# Patient Record
Sex: Female | Born: 1970 | Race: White | Hispanic: No | Marital: Single | Smoking: Current every day smoker
Health system: Southern US, Community
[De-identification: ages and names within clinical notes are randomized; demographics above are authoritative.]

## PROBLEM LIST (undated history)

## (undated) DIAGNOSIS — C801 Malignant (primary) neoplasm, unspecified: Secondary | ICD-10-CM

## (undated) DIAGNOSIS — J45909 Unspecified asthma, uncomplicated: Secondary | ICD-10-CM

## (undated) HISTORY — DX: Malignant (primary) neoplasm, unspecified: C80.1

## (undated) HISTORY — DX: Unspecified asthma, uncomplicated: J45.909

## (undated) HISTORY — PX: APPENDECTOMY: SHX54

## (undated) HISTORY — PX: TUBAL LIGATION: SHX77

---

## 2004-06-18 LAB — HM COLONOSCOPY: HM Colonoscopy: NORMAL

## 2004-09-19 ENCOUNTER — Emergency Department: Payer: Self-pay | Admitting: Emergency Medicine

## 2005-01-08 ENCOUNTER — Ambulatory Visit: Payer: Self-pay | Admitting: Gastroenterology

## 2005-06-19 ENCOUNTER — Encounter: Admission: RE | Admit: 2005-06-19 | Discharge: 2005-06-19 | Payer: Self-pay | Admitting: Internal Medicine

## 2005-07-19 ENCOUNTER — Other Ambulatory Visit: Admission: RE | Admit: 2005-07-19 | Discharge: 2005-07-19 | Payer: Self-pay | Admitting: Internal Medicine

## 2005-08-09 ENCOUNTER — Encounter: Admission: RE | Admit: 2005-08-09 | Discharge: 2005-08-09 | Payer: Self-pay | Admitting: Internal Medicine

## 2005-08-16 ENCOUNTER — Encounter: Admission: RE | Admit: 2005-08-16 | Discharge: 2005-08-16 | Payer: Self-pay | Admitting: Internal Medicine

## 2006-02-14 ENCOUNTER — Encounter: Admission: RE | Admit: 2006-02-14 | Discharge: 2006-02-14 | Payer: Self-pay | Admitting: Internal Medicine

## 2006-02-18 DIAGNOSIS — C801 Malignant (primary) neoplasm, unspecified: Secondary | ICD-10-CM

## 2006-02-18 HISTORY — DX: Malignant (primary) neoplasm, unspecified: C80.1

## 2006-07-07 ENCOUNTER — Ambulatory Visit: Payer: Self-pay | Admitting: Dermatology

## 2006-08-18 ENCOUNTER — Encounter: Admission: RE | Admit: 2006-08-18 | Discharge: 2006-08-18 | Payer: Self-pay | Admitting: Internal Medicine

## 2007-08-19 ENCOUNTER — Encounter: Admission: RE | Admit: 2007-08-19 | Discharge: 2007-08-19 | Payer: Self-pay | Admitting: Internal Medicine

## 2007-10-14 ENCOUNTER — Other Ambulatory Visit: Admission: RE | Admit: 2007-10-14 | Discharge: 2007-10-14 | Payer: Self-pay | Admitting: Internal Medicine

## 2010-02-07 ENCOUNTER — Other Ambulatory Visit
Admission: RE | Admit: 2010-02-07 | Discharge: 2010-02-07 | Payer: Self-pay | Source: Home / Self Care | Admitting: Internal Medicine

## 2010-02-15 ENCOUNTER — Encounter
Admission: RE | Admit: 2010-02-15 | Discharge: 2010-02-15 | Payer: Self-pay | Source: Home / Self Care | Attending: Internal Medicine | Admitting: Internal Medicine

## 2010-03-11 ENCOUNTER — Encounter: Payer: Self-pay | Admitting: Internal Medicine

## 2011-05-29 ENCOUNTER — Other Ambulatory Visit: Payer: Self-pay | Admitting: Internal Medicine

## 2011-05-29 DIAGNOSIS — Z1231 Encounter for screening mammogram for malignant neoplasm of breast: Secondary | ICD-10-CM

## 2011-06-03 ENCOUNTER — Ambulatory Visit: Payer: Self-pay

## 2011-06-26 ENCOUNTER — Ambulatory Visit
Admission: RE | Admit: 2011-06-26 | Discharge: 2011-06-26 | Disposition: A | Discharge status: Home / Self Care | Payer: Commercial Indemnity | Source: Ambulatory Visit | Attending: Internal Medicine | Admitting: Internal Medicine

## 2011-06-26 ENCOUNTER — Other Ambulatory Visit (HOSPITAL_COMMUNITY)
Admission: RE | Admit: 2011-06-26 | Discharge: 2011-06-26 | Disposition: A | Discharge status: Home / Self Care | Payer: Commercial Indemnity | Source: Ambulatory Visit | Attending: Internal Medicine | Admitting: Internal Medicine

## 2011-06-26 ENCOUNTER — Other Ambulatory Visit: Payer: Self-pay | Admitting: Internal Medicine

## 2011-06-26 DIAGNOSIS — Z01419 Encounter for gynecological examination (general) (routine) without abnormal findings: Secondary | ICD-10-CM | POA: Insufficient documentation

## 2011-06-26 DIAGNOSIS — Z1231 Encounter for screening mammogram for malignant neoplasm of breast: Secondary | ICD-10-CM

## 2012-02-19 HISTORY — PX: ABDOMINAL HYSTERECTOMY: SHX81

## 2012-04-14 ENCOUNTER — Ambulatory Visit: Payer: Self-pay | Admitting: Obstetrics and Gynecology

## 2012-04-14 LAB — CBC
HCT: 40.8 % (ref 35.0–47.0)
HGB: 13.8 g/dL (ref 12.0–16.0)
MCH: 30.6 pg (ref 26.0–34.0)
Platelet: 421 10*3/uL (ref 150–440)

## 2012-04-14 LAB — URINALYSIS, COMPLETE
Glucose,UR: NEGATIVE mg/dL (ref 0–75)
Leukocyte Esterase: NEGATIVE
Ph: 5 (ref 4.5–8.0)
Squamous Epithelial: 2

## 2012-04-21 ENCOUNTER — Emergency Department: Payer: Self-pay | Admitting: Emergency Medicine

## 2012-05-11 ENCOUNTER — Ambulatory Visit: Payer: Self-pay | Admitting: Obstetrics and Gynecology

## 2012-05-12 LAB — HEMATOCRIT: HCT: 36.5 % (ref 35.0–47.0)

## 2012-05-12 LAB — PATHOLOGY REPORT

## 2012-06-18 ENCOUNTER — Encounter: Payer: Self-pay | Admitting: Internal Medicine

## 2012-06-18 ENCOUNTER — Ambulatory Visit (INDEPENDENT_AMBULATORY_CARE_PROVIDER_SITE_OTHER): Payer: Commercial Indemnity | Admitting: Internal Medicine

## 2012-06-18 VITALS — BP 120/80 | HR 83 | Temp 98.9°F | Ht 63.25 in | Wt 171.0 lb

## 2012-06-18 DIAGNOSIS — R519 Headache, unspecified: Secondary | ICD-10-CM | POA: Insufficient documentation

## 2012-06-18 DIAGNOSIS — Z0000 Encounter for general adult medical examination without abnormal findings: Secondary | ICD-10-CM

## 2012-06-18 DIAGNOSIS — Z634 Disappearance and death of family member: Secondary | ICD-10-CM

## 2012-06-18 DIAGNOSIS — R51 Headache: Secondary | ICD-10-CM | POA: Insufficient documentation

## 2012-06-18 DIAGNOSIS — F411 Generalized anxiety disorder: Secondary | ICD-10-CM | POA: Insufficient documentation

## 2012-06-18 DIAGNOSIS — R41840 Attention and concentration deficit: Secondary | ICD-10-CM | POA: Insufficient documentation

## 2012-06-18 MED ORDER — FLUOXETINE HCL 20 MG PO TABS: 20.0000 mg | ORAL_TABLET | Freq: Every day | ORAL | Status: DC

## 2012-06-18 NOTE — Assessment & Plan Note (Signed)
Ongoing bereavement after death of father 2007-07-01. Offered support today. Will set up counseling.

## 2012-06-18 NOTE — Progress Notes (Signed)
Subjective:    Patient ID: Debbie Campbell, female    DOB: [birth year], 42 y.o.   MRN: [id number]  HPI 42 year old female with history of anxiety, headaches, and asthma presents to establish care. Her primary concern today is increased anxiety and depressed mood. She reports that symptoms first began after the death of her father 5 years ago. 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 She reports that this is extremely stressful. She also describes increased stressors at work. She has been taking Valium as prescribed by her previous PCP with minimal improvement. She has never undergone counseling. She reports that anxious mood limits her ability to function at work and affect her concentration. She also notes a history of ADD in the past as a child for which she was on medication. She questions whether she needs to resume stimulant medication to help with symptoms. She reports difficulty sleeping. She reports feeling overwhelmed. No suicidal ideation.  In regards to headaches, they're described as aching typically on one side of the head. No photo or phonophobia. No nausea or visual changes. They have been ongoing for years but recently seemed more frequent. Her former PCP checked a CT of the head for evaluation which was normal. She has been using tramadol for headache pain with minimal improvement.  Outpatient Encounter Prescriptions as of 06/18/2012  Medication Sig Dispense Refill  . diazepam (VALIUM) 5 MG tablet       . traMADol (ULTRAM) 50 MG tablet       . VENTOLIN HFA 108 (90 BASE) MCG/ACT inhaler       . FLUoxetine (PROZAC) 20 MG tablet Take 1 tablet (20 mg total) by mouth daily.  30 tablet  3  . [DISCONTINUED] HYDROcodone-acetaminophen (NORCO/VICODIN) 5-325 MG per tablet        No facility-administered encounter medications on file as of 06/18/2012.   BP 120/80  Pulse 83  Temp(Src) 98.9 F (37.2 C) (Oral)  Ht 5' 3.25" (1.607 m)  Wt 171 lb (77.565 kg)  BMI  30.04 kg/m2  SpO2 97%  Review of Systems  Constitutional: Negative for fever, chills, appetite change, fatigue and unexpected weight change.  HENT: Negative for ear pain, congestion, sore throat, trouble swallowing, neck pain, voice change and sinus pressure.   Eyes: Negative for visual disturbance.  Respiratory: Negative for cough, shortness of breath, wheezing and stridor.   Cardiovascular: Negative for chest pain, palpitations and leg swelling.  Gastrointestinal: Negative for nausea, vomiting, abdominal pain, diarrhea, constipation, blood in stool, abdominal distention and anal bleeding.  Genitourinary: Negative for dysuria and flank pain.  Musculoskeletal: Negative for myalgias, arthralgias and gait problem.  Skin: Negative for color change and rash.  Neurological: Negative for dizziness and headaches.  Hematological: Negative for adenopathy. Does not bruise/bleed easily.  Psychiatric/Behavioral: Positive for sleep disturbance, dysphoric mood and decreased concentration. Negative for suicidal ideas. The patient is nervous/anxious.        Objective:   Physical Exam  Constitutional: She is oriented to person, place, and time. She appears well-developed and well-nourished. No distress.  HENT:  Head: Normocephalic and atraumatic.  Right Ear: External ear normal.  Left Ear: External ear normal.  Nose: Nose normal.  Mouth/Throat: Oropharynx is clear and moist. No oropharyngeal exudate.  Eyes: Conjunctivae are normal. Pupils are equal, round, and reactive to light. Right eye exhibits no discharge. Left eye exhibits no discharge. No scleral icterus.  Neck: Normal range of motion. Neck supple. No tracheal deviation present.  No thyromegaly present.  Cardiovascular: Normal rate, regular rhythm, normal heart sounds and intact distal pulses.  Exam reveals no gallop and no friction rub.   No murmur heard. Pulmonary/Chest: Effort normal. No accessory muscle usage. Not tachypneic. No respiratory  distress. She has no decreased breath sounds. She has no wheezes. She has no rhonchi. She has no rales. She exhibits no tenderness.  Abdominal: Soft. Bowel sounds are normal. She exhibits no distension. There is no tenderness.  Musculoskeletal: Normal range of motion. She exhibits no edema and no tenderness.  Lymphadenopathy:    She has no cervical adenopathy.  Neurological: She is alert and oriented to person, place, and time. No cranial nerve deficit. She exhibits normal muscle tone. Coordination normal.  Skin: Skin is warm and dry. No rash noted. She is not diaphoretic. No erythema. No pallor.  Psychiatric: Her speech is normal and behavior is normal. Judgment and thought content normal. Her mood appears anxious. Cognition and memory are normal.          Assessment & Plan:

## 2012-06-18 NOTE — Assessment & Plan Note (Addendum)
Increased anxiety and depressed mood after fathers death 5 years ago and with ongoing issues caring for her mother. Offered support today. Will set up counseling. Will also start Fluoxetine 20mg  daily. Follow up 3-4 weeks and prn.

## 2012-06-18 NOTE — Assessment & Plan Note (Signed)
Pt reports h/o ADD. Now having difficulty concentrating which is most likely related to ongoing anxiety. Will set up psychology evaluation with ADD testing.

## 2012-06-18 NOTE — Assessment & Plan Note (Signed)
Recent episodes of headache described as diffuse aching. Most consistent with tension headaches. Suspect exacerbated by anxiety. Will have pt keep headache diary. Will continue prn tramadol for now. Reviewed CT head 2007 which was normal. Follow up 3-4 weeks.

## 2012-07-14 ENCOUNTER — Emergency Department: Payer: Self-pay | Admitting: Emergency Medicine

## 2012-07-14 LAB — COMPREHENSIVE METABOLIC PANEL
Alkaline Phosphatase: 72 U/L (ref 50–136)
Anion Gap: 6 — ABNORMAL LOW (ref 7–16)
BUN: 13 mg/dL (ref 7–18)
Bilirubin,Total: 0.2 mg/dL (ref 0.2–1.0)
Calcium, Total: 9.2 mg/dL (ref 8.5–10.1)
Chloride: 106 mmol/L (ref 98–107)
Co2: 27 mmol/L (ref 21–32)
Creatinine: 0.71 mg/dL (ref 0.60–1.30)
Glucose: 79 mg/dL (ref 65–99)
Potassium: 3.8 mmol/L (ref 3.5–5.1)
SGOT(AST): 21 U/L (ref 15–37)
SGPT (ALT): 25 U/L (ref 12–78)
Sodium: 139 mmol/L (ref 136–145)

## 2012-07-14 LAB — URINALYSIS, COMPLETE
Bilirubin,UR: NEGATIVE
Glucose,UR: NEGATIVE mg/dL (ref 0–75)
Ketone: NEGATIVE
Leukocyte Esterase: NEGATIVE
Nitrite: NEGATIVE
Ph: 6 (ref 4.5–8.0)
RBC,UR: 1 /HPF (ref 0–5)
Specific Gravity: 1.006 (ref 1.003–1.030)
Squamous Epithelial: 2

## 2012-07-14 LAB — CBC
HCT: 39.2 % (ref 35.0–47.0)
HGB: 13.5 g/dL (ref 12.0–16.0)
MCV: 89 fL (ref 80–100)
Platelet: 415 10*3/uL (ref 150–440)
RBC: 4.42 10*6/uL (ref 3.80–5.20)
WBC: 6.8 10*3/uL (ref 3.6–11.0)

## 2012-07-20 ENCOUNTER — Ambulatory Visit: Payer: Commercial Indemnity | Admitting: Internal Medicine

## 2012-08-04 ENCOUNTER — Ambulatory Visit: Payer: Commercial Indemnity | Admitting: Internal Medicine

## 2012-08-07 ENCOUNTER — Telehealth: Payer: Self-pay | Admitting: Internal Medicine

## 2012-08-07 NOTE — Telephone Encounter (Signed)
Patient Information:  Caller Name: Maryama  Phone: [phone]  Patient: Debbie Campbell, Debbie Campbell  Gender: Female  DOB: [birth year]  Age: 42 Years  PCP: Ronna Polio (Adults only)  Pregnant: No  Office Follow Up:  Does the office need to follow up with this patient?: No  Instructions For The Office: HEADS UP THAT PT WAS SENT TO ED- TRIAGE MARKED AS HIGH PRIORITY   Symptoms  Reason For Call & Symptoms: Pt is calling and states that she is vomiting; sx started 08/07/12; vomited x 7 today; diarrhea x3-4 times today;  feeling very nauseated for the last 3 weeks;  the nasueated started soon after the hysterectomy that she had in March;  did have an infection in incision that she staets is completely healed now;  no sx of infection;  no fever; reporting severe headache now; rates 7-8/10; headache started 08/07/12; Motrin taken with no relief; pt states that she feels very weak  Reviewed Health History In EMR: Yes  Reviewed Medications In EMR: Yes  Reviewed Allergies In EMR: Yes  Reviewed Surgeries / Procedures: Yes  Date of Onset of Symptoms: 08/07/2012  Treatments Tried: Pepcid  Treatments Tried Worked: No OB / GYN:  LMP: Unknown  Guideline(s) Used:  Vomiting  Headache  Disposition Per Guideline:   Go to ED Now (or to Office with PCP Approval)  Reason For Disposition Reached:   Severe headache, states "worst headache" of life  Advice Given:  N/A  RN Overrode Recommendation:  Go To ED  Next office appt is 4:15pm and pt is willing to go to ED for evaluation.  She states that she is feeling very weak along with headache, nausea, vomiting and diarrhea; Pt will go to [facility] ED with son driving

## 2012-08-07 NOTE — Telephone Encounter (Signed)
Please Advise

## 2012-08-07 NOTE — Telephone Encounter (Signed)
Agree. Needs to be evaluated in the ED.

## 2012-08-08 ENCOUNTER — Emergency Department: Payer: Self-pay | Admitting: Emergency Medicine

## 2012-08-08 LAB — CBC
HCT: 41.8 % (ref 35.0–47.0)
HGB: 14.8 g/dL (ref 12.0–16.0)
MCHC: 35.4 g/dL (ref 32.0–36.0)
MCV: 87 fL (ref 80–100)
Platelet: 487 10*3/uL — ABNORMAL HIGH (ref 150–440)
RDW: 12.1 % (ref 11.5–14.5)

## 2012-08-08 LAB — LIPASE, BLOOD: Lipase: 132 U/L (ref 73–393)

## 2012-08-08 LAB — COMPREHENSIVE METABOLIC PANEL
Albumin: 4.3 g/dL (ref 3.4–5.0)
Alkaline Phosphatase: 72 U/L (ref 50–136)
Anion Gap: 9 (ref 7–16)
BUN: 16 mg/dL (ref 7–18)
Bilirubin,Total: 0.4 mg/dL (ref 0.2–1.0)
Calcium, Total: 10 mg/dL (ref 8.5–10.1)
Chloride: 102 mmol/L (ref 98–107)
Co2: 26 mmol/L (ref 21–32)
EGFR (African American): >60
EGFR (Non-African Amer.): >60
Glucose: 140 mg/dL — ABNORMAL HIGH (ref 65–99)
Osmolality: 277 (ref 275–301)
Potassium: 3.5 mmol/L (ref 3.5–5.1)
SGOT(AST): 11 U/L — ABNORMAL LOW (ref 15–37)
Sodium: 137 mmol/L (ref 136–145)

## 2012-08-08 LAB — URINALYSIS, COMPLETE
Blood: NEGATIVE
Glucose,UR: NEGATIVE mg/dL (ref 0–75)
Leukocyte Esterase: NEGATIVE
Nitrite: NEGATIVE
Ph: 9 (ref 4.5–8.0)
RBC,UR: 6 /HPF (ref 0–5)
Specific Gravity: 1.026 (ref 1.003–1.030)
Squamous Epithelial: 12
WBC UR: 1 /HPF (ref 0–5)

## 2012-08-10 ENCOUNTER — Emergency Department: Payer: Self-pay | Admitting: Emergency Medicine

## 2012-08-10 LAB — COMPREHENSIVE METABOLIC PANEL
Alkaline Phosphatase: 64 U/L (ref 50–136)
Anion Gap: 8 (ref 7–16)
BUN: 13 mg/dL (ref 7–18)
Bilirubin,Total: 0.3 mg/dL (ref 0.2–1.0)
Chloride: 103 mmol/L (ref 98–107)
Co2: 27 mmol/L (ref 21–32)
Creatinine: 0.94 mg/dL (ref 0.60–1.30)
EGFR (Non-African Amer.): >60
Glucose: 108 mg/dL — ABNORMAL HIGH (ref 65–99)
Sodium: 138 mmol/L (ref 136–145)
Total Protein: 7.1 g/dL (ref 6.4–8.2)

## 2012-08-10 LAB — URINALYSIS, COMPLETE
Bilirubin,UR: NEGATIVE
Blood: NEGATIVE
Ketone: NEGATIVE
Nitrite: NEGATIVE
Protein: NEGATIVE
RBC,UR: 3 /HPF (ref 0–5)
Squamous Epithelial: 6
WBC UR: 2 /HPF (ref 0–5)

## 2012-08-10 LAB — CBC WITH DIFFERENTIAL/PLATELET
Basophil #: 0 10*3/uL (ref 0.0–0.1)
Basophil %: 0.4 %
Eosinophil #: 0.1 10*3/uL (ref 0.0–0.7)
Eosinophil %: 0.5 %
HCT: 42 % (ref 35.0–47.0)
HGB: 14.4 g/dL (ref 12.0–16.0)
Lymphocyte #: 2.1 10*3/uL (ref 1.0–3.6)
MCH: 30.5 pg (ref 26.0–34.0)
MCHC: 34.3 g/dL (ref 32.0–36.0)
MCV: 89 fL (ref 80–100)
Monocyte #: 0.5 x10 3/mm (ref 0.2–0.9)
Neutrophil #: 9 10*3/uL — ABNORMAL HIGH (ref 1.4–6.5)
RDW: 12.3 % (ref 11.5–14.5)
WBC: 11.6 10*3/uL — ABNORMAL HIGH (ref 3.6–11.0)

## 2012-08-12 ENCOUNTER — Encounter: Payer: Self-pay | Admitting: Adult Health

## 2012-08-12 ENCOUNTER — Ambulatory Visit (INDEPENDENT_AMBULATORY_CARE_PROVIDER_SITE_OTHER): Payer: Commercial Indemnity | Admitting: Adult Health

## 2012-08-12 VITALS — BP 128/68 | HR 76 | Temp 98.3°F | Resp 12 | Wt 164.6 lb

## 2012-08-12 DIAGNOSIS — F411 Generalized anxiety disorder: Secondary | ICD-10-CM

## 2012-08-12 DIAGNOSIS — R6889 Other general symptoms and signs: Secondary | ICD-10-CM

## 2012-08-12 DIAGNOSIS — R112 Nausea with vomiting, unspecified: Secondary | ICD-10-CM

## 2012-08-12 DIAGNOSIS — R899 Unspecified abnormal finding in specimens from other organs, systems and tissues: Secondary | ICD-10-CM

## 2012-08-12 DIAGNOSIS — R51 Headache: Secondary | ICD-10-CM

## 2012-08-12 LAB — POCT URINALYSIS DIPSTICK
Ketones, UA: NEGATIVE
Leukocytes, UA: NEGATIVE
Spec Grav, UA: 1.025
pH, UA: 7

## 2012-08-12 LAB — CBC WITH DIFFERENTIAL/PLATELET
Eosinophils Absolute: 0.1 10*3/uL (ref 0.0–0.7)
Eosinophils Relative: 0.9 % (ref 0.0–5.0)
HCT: 37.8 % (ref 36.0–46.0)
Hemoglobin: 12.9 g/dL (ref 12.0–15.0)
Lymphs Abs: 2.6 10*3/uL (ref 0.7–4.0)
Monocytes Absolute: 0.3 10*3/uL (ref 0.1–1.0)
RDW: 12.2 % (ref 11.5–14.6)
WBC: 9.5 10*3/uL (ref 4.5–10.5)

## 2012-08-12 LAB — BASIC METABOLIC PANEL
Creatinine, Ser: 0.7 mg/dL (ref 0.4–1.2)
Potassium: 3.1 mEq/L — ABNORMAL LOW (ref 3.5–5.1)

## 2012-08-12 MED ORDER — BUTALBITAL-APAP-CAFFEINE 50-325-40 MG PO TABS: 1.0000 | ORAL_TABLET | Freq: Two times a day (BID) | ORAL | Status: DC | PRN

## 2012-08-12 MED ORDER — ALPRAZOLAM 0.5 MG PO TABS: 0.5000 mg | ORAL_TABLET | Freq: Every evening | ORAL | Status: DC | PRN

## 2012-08-12 MED ORDER — ALBUTEROL SULFATE HFA 108 (90 BASE) MCG/ACT IN AERS: 1.0000 | INHALATION_SPRAY | Freq: Four times a day (QID) | RESPIRATORY_TRACT | Status: DC | PRN

## 2012-08-12 NOTE — Assessment & Plan Note (Signed)
Headache possibly due to significant amount of nausea and vomiting without any oral intake. She is taking Tylenol but this is currently not helping.  Fioricet for severe headache. Return for followup in one to 2 weeks.

## 2012-08-12 NOTE — Progress Notes (Signed)
Subjective:    Patient ID: Debbie Campbell, female    DOB: May 09, 1970, 42 y.o.   MRN: [id number]  HPI  Patient is a pleasant 41 year old female who presents to clinic for followup recent ED visit. Patient had 2 visits to ED after multiple episodes of vomiting. The first visit was on 08/08/2012 and the second visit was 08/10/2012. She had workup on 08/08/12 consisting of  Abdominal CT which showed diverticulosis and right ovarian cyst. Blood work as follows: WBC 18.2, hemoglobin 14.8, hematocrit 41.8, platelets 487, BUN 16, creatinine 0.86, sodium 137, potassium 3.5, liver enzymes were normal. Patient was discharged home with Zofran for nausea and vomiting. At home, symptoms persisted and patient continued vomiting despite medication. She returned to the ED on 08/10/2012. Repeat labs showed WBC improving at 11.6, BUN and creatinine were normal as well his sodium. Potassium was low at 3.2. EKG showed normal sinus rhythm. Patient was given IV hydration. Once she was observed to tolerating PO patient was sent home on Phenergan suppositories. Today she is feeling less nauseated, but still not feeling fully well. She is complaining of a headache.she has taken Tylenol and ibuprofen without improvement.  Patient is tearful during visit. She reports that she has never been this sick. "I am the one that is always taking care of everybody".    Past Medical History  Diagnosis Date  . Asthma   . 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 Tubal ligation    . Appendectomy    . Abdominal hysterectomy  2014    Dr. Logan Bores, menorrhagia, ovaries intact    Family History  Problem Relation Age of Onset  . Hypertension Mother   . Arthritis Father   . Cirrhosis Father     from tylenol toxicity    History   Social History  . Marital Status: Single    Spouse Name: N/A    Number of Children: N/A  .  Years of Education: N/A   Occupational History  . Not on file.   Social History Main Topics  . Smoking status: Current Every Day Smoker -- 0.25 packs/day    Types: Cigarettes  . Smokeless tobacco: Never Used  . Alcohol Use: No  . Drug Use: Not on file  . Sexually Active: Not on file   Other Topics Concern  . Not on file   Social History Narrative   Lives in [place], From [place].      Work - [employer]      Review of Systems  Constitutional: Positive for fatigue. Negative for fever, chills and appetite change.       It is not back to normal. Patient has not had anything to eat since her episodes of vomiting on Monday.  Eyes: Negative.   Respiratory: Negative for chest tightness, shortness of breath and wheezing.   Cardiovascular: Negative for chest pain, palpitations and leg swelling.  Gastrointestinal: Negative for nausea, vomiting, abdominal pain, diarrhea, blood in stool and abdominal distention.       Abdomen tender at epigastric area.  Neurological: Positive for headaches. Negative for dizziness, syncope, weakness, light-headedness and numbness.  Psychiatric/Behavioral: The patient is nervous/anxious.   All other systems reviewed and are negative.    BP 128/68  Pulse 76  Temp(Src) 98.3 F (36.8 C) (Oral)  Resp 12  Wt 164 lb 9.6 oz (74.662 kg)  BMI  28.91 kg/m2  SpO2 99%    Objective:   Physical Exam  Constitutional: She is oriented to person, place, and time. She appears well-developed and well-nourished.  Clean, groomed. Appears to be in a mildly weakened state  HENT:  Head: Normocephalic and atraumatic.  Eyes: Conjunctivae are normal. Pupils are equal, round, and reactive to light.  Cardiovascular: Normal rate, regular rhythm and normal heart sounds.  Exam reveals no gallop and no friction rub.   No murmur heard. Pulmonary/Chest: Effort normal and breath sounds normal. No respiratory distress. She has no wheezes. She has no rales. She exhibits no  tenderness.  Abdominal: Soft. Bowel sounds are normal. She exhibits no mass. There is tenderness. There is no rebound and no guarding.  Mild tenderness upon palpating epigastric area. No other abdominal areas tender.  Musculoskeletal: Normal range of motion. She exhibits no edema.  Neurological: She is alert and oriented to person, place, and time. Coordination normal.  Skin: Skin is warm and dry.  Psychiatric: She has a normal mood and affect. Her behavior is normal. Judgment and thought content normal.  Patient was tearful during clinic.        Assessment & Plan:

## 2012-08-12 NOTE — Assessment & Plan Note (Signed)
Patient feels she has been on Valium too long and has become tolerant to the drug. Stop Valium for medication holiday. Try Xanax 0.5 mg daily as needed for anxiety. Return to clinic in 1-2 weeks for followup.

## 2012-08-12 NOTE — Assessment & Plan Note (Addendum)
Nausea and vomiting severe requiring 2 separate ED visits. Patient with hypokalemia. She is feeling improved. However, patient is very tearful. Anxious about "being so sick". Counseling and reassurance provided regarding her symptoms. Recheck labs: CBC with differential, metabolic panel and urinalysis. Start Nexium 40 mg daily. Samples provided. If this helps patient's epigastric discomfort, she will continue with over-the-counter PPI. Return for followup in one to 2 weeks. Note greater than 45 minutes were spent in face-to-face communication with patient in counseling, assessment, planning and implementation of the problems listed during this visit.

## 2012-08-12 NOTE — Patient Instructions (Addendum)
  Please have labs drawn prior to leaving the office.  Once I get the results from your labwork, I will contact you.  Start back on the Prozac.  I am prescribing Xanax 0.5 mg one tablet as needed.  I am giving you a sample of Nexium for your stomach. Take 1 capsule first thing in the morning. If this is helpful, you can continue taking this medication. It is soled over-the-counter.  Take Fioricet for severe headache. You can take up to 2 daily as needed. If your headaches are not severe, take Tylenol.

## 2012-08-17 ENCOUNTER — Other Ambulatory Visit (INDEPENDENT_AMBULATORY_CARE_PROVIDER_SITE_OTHER): Payer: Commercial Indemnity

## 2012-08-17 ENCOUNTER — Telehealth: Payer: Self-pay | Admitting: Internal Medicine

## 2012-08-17 DIAGNOSIS — R6889 Other general symptoms and signs: Secondary | ICD-10-CM

## 2012-08-17 DIAGNOSIS — Z0000 Encounter for general adult medical examination without abnormal findings: Secondary | ICD-10-CM

## 2012-08-17 DIAGNOSIS — R899 Unspecified abnormal finding in specimens from other organs, systems and tissues: Secondary | ICD-10-CM

## 2012-08-17 DIAGNOSIS — Z0279 Encounter for issue of other medical certificate: Secondary | ICD-10-CM

## 2012-08-17 LAB — COMPREHENSIVE METABOLIC PANEL
ALT: 21 U/L (ref 0–35)
AST: 16 U/L (ref 0–37)
Albumin: 3.9 g/dL (ref 3.5–5.2)
Calcium: 9.2 mg/dL (ref 8.4–10.5)
Chloride: 106 mEq/L (ref 96–112)
Creatinine, Ser: 0.7 mg/dL (ref 0.4–1.2)
Potassium: 4.5 mEq/L (ref 3.5–5.1)
Sodium: 136 mEq/L (ref 135–145)
Total Protein: 6.7 g/dL (ref 6.0–8.3)

## 2012-08-17 LAB — CBC WITH DIFFERENTIAL/PLATELET
Basophils Absolute: 0 10*3/uL (ref 0.0–0.1)
Basophils Relative: 0.3 % (ref 0.0–3.0)
Eosinophils Absolute: 0.3 10*3/uL (ref 0.0–0.7)
MCHC: 33.8 g/dL (ref 30.0–36.0)
MCV: 92.2 fl (ref 78.0–100.0)
Monocytes Absolute: 0.4 10*3/uL (ref 0.1–1.0)
Neutrophils Relative %: 70.1 % (ref 43.0–77.0)
RBC: 4.21 Mil/uL (ref 3.87–5.11)
RDW: 13.1 % (ref 11.5–14.6)

## 2012-08-17 LAB — LIPID PANEL: Triglycerides: 176 mg/dL — ABNORMAL HIGH (ref 0.0–149.0)

## 2012-08-17 LAB — BASIC METABOLIC PANEL
CO2: 24 mEq/L (ref 19–32)
Chloride: 106 mEq/L (ref 96–112)
Creatinine, Ser: 0.7 mg/dL (ref 0.4–1.2)
Sodium: 136 mEq/L (ref 135–145)

## 2012-08-17 NOTE — Telephone Encounter (Signed)
Forms in Dr. Dan Humphreys folder

## 2012-08-17 NOTE — Telephone Encounter (Signed)
Pt dropped off [facility] forms   In box

## 2012-08-18 ENCOUNTER — Encounter: Payer: Self-pay | Admitting: *Deleted

## 2012-08-18 LAB — VITAMIN D 25 HYDROXY (VIT D DEFICIENCY, FRACTURES): Vit D, 25-Hydroxy: 34 ng/mL (ref 30–89)

## 2012-08-19 NOTE — Telephone Encounter (Signed)
Forms completed and ready to be picked up. Patient aware, forms up front for pick up

## 2012-08-26 ENCOUNTER — Ambulatory Visit (INDEPENDENT_AMBULATORY_CARE_PROVIDER_SITE_OTHER): Payer: Commercial Indemnity | Admitting: Adult Health

## 2012-08-26 ENCOUNTER — Encounter: Payer: Self-pay | Admitting: Adult Health

## 2012-08-26 VITALS — BP 106/70 | HR 100 | Temp 98.0°F | Resp 12 | Wt 163.0 lb

## 2012-08-26 DIAGNOSIS — R112 Nausea with vomiting, unspecified: Secondary | ICD-10-CM

## 2012-08-26 DIAGNOSIS — K219 Gastro-esophageal reflux disease without esophagitis: Secondary | ICD-10-CM | POA: Insufficient documentation

## 2012-08-26 DIAGNOSIS — R51 Headache: Secondary | ICD-10-CM

## 2012-08-26 MED ORDER — PANTOPRAZOLE SODIUM 20 MG PO TBEC: 20.0000 mg | DELAYED_RELEASE_TABLET | Freq: Every day | ORAL | Status: AC

## 2012-08-26 MED ORDER — VARENICLINE TARTRATE 0.5 MG X 11 & 1 MG X 42 PO MISC: ORAL | Status: DC

## 2012-08-26 NOTE — Assessment & Plan Note (Signed)
Symptoms have completely resolved. She is feeling well. Taking Nexium daily for her acid reflux.

## 2012-08-26 NOTE — Progress Notes (Signed)
  Subjective:    Patient ID: Debbie Campbell, female    DOB: 03-18-1970, 42 y.o.   MRN: [id number]  HPI  Is a pleasant 42 year old female who presents to clinic for followup of nausea and vomiting and headaches. She was seen in clinic approximately 2 weeks ago after going to the emergency room x2 for significant nausea and vomiting. She was also reporting stomach upset and was started on Nexium. Patient reports that this has helped her significantly. For the headache, she was started on Fioricet. Patient reports having to take this medication twice with good results. She is feeling much better and is in great spirits.  Current Outpatient Prescriptions on File Prior to Visit  Medication Sig Dispense Refill  . albuterol (VENTOLIN HFA) 108 (90 BASE) MCG/ACT inhaler Inhale 1 puff into the lungs every 6 (six) hours as needed for wheezing.  1 Inhaler  11  . ALPRAZolam (XANAX) 0.5 MG tablet Take 1 tablet (0.5 mg total) by mouth at bedtime as needed for sleep.  30 tablet  0  . butalbital-acetaminophen-caffeine (FIORICET, ESGIC) 50-325-40 MG per tablet Take 1 tablet by mouth 2 (two) times daily as needed for headache.  30 tablet  0  . FLUoxetine (PROZAC) 20 MG tablet Take 1 tablet (20 mg total) by mouth daily.  30 tablet  3  . ondansetron (ZOFRAN-ODT) 4 MG disintegrating tablet Take 4 mg by mouth every 8 (eight) hours as needed.       Marland Kitchen oxyCODONE-acetaminophen (PERCOCET/ROXICET) 5-325 MG per tablet Take 1 tablet by mouth every 6 (six) hours as needed.       . promethazine (PHENERGAN) 25 MG suppository Place 25 mg rectally every 6 (six) hours as needed.        No current facility-administered medications on file prior to visit.    Review of Systems  Constitutional: Negative.   Respiratory: Negative.   Cardiovascular: Negative.   Gastrointestinal: Negative.   Neurological: Negative for headaches.  All other systems reviewed and are negative.       Objective:   Physical Exam  Constitutional:  She is oriented to person, place, and time.  Overweight, pleasant female in no apparent distress  HENT:  Head: Normocephalic and atraumatic.  Cardiovascular: Normal rate, regular rhythm and normal heart sounds.  Exam reveals no gallop and no friction rub.   No murmur heard. Pulmonary/Chest: Effort normal and breath sounds normal. No respiratory distress.  Abdominal: Soft. Bowel sounds are normal.  Lymphadenopathy:    She has no cervical adenopathy.  Neurological: She is alert and oriented to person, place, and time.  Skin: Skin is warm and dry.  Psychiatric: She has a normal mood and affect. Her behavior is normal. Judgment and thought content normal.          Assessment & Plan:

## 2012-08-26 NOTE — Assessment & Plan Note (Signed)
Patient is doing well on Nexium. Samples have been provided during previous visit. Unfortunately this medication is not covered on her insurance plan. Ordered Protonix 20 mg daily.

## 2012-08-26 NOTE — Patient Instructions (Addendum)
  Continue to take medication for acid reflux. Your insurance is not covering the Nexium so I am ordering Protonix.  Continue Fioricet as needed for headaches.  Please call if any questions or concerns.

## 2012-08-26 NOTE — Assessment & Plan Note (Signed)
No headaches reported since seen approximately 2 weeks ago. Fioricet helped with her headache. Continue as needed.

## 2012-08-31 ENCOUNTER — Telehealth: Payer: Self-pay | Admitting: Internal Medicine

## 2012-08-31 NOTE — Telephone Encounter (Signed)
Form completed and faxed. 

## 2012-08-31 NOTE — Telephone Encounter (Signed)
Patient dropped off [facility] paperwork to be filled out  On form #5 needs exact dates.  Pt stated this needed to be faxed back before 09/07/12.  In box

## 2012-09-08 ENCOUNTER — Other Ambulatory Visit: Payer: Self-pay | Admitting: Adult Health

## 2012-09-09 NOTE — Telephone Encounter (Signed)
Ok to refill 

## 2012-09-10 ENCOUNTER — Encounter: Payer: Self-pay | Admitting: Emergency Medicine

## 2012-09-10 ENCOUNTER — Encounter: Payer: Self-pay | Admitting: Adult Health

## 2012-09-10 ENCOUNTER — Ambulatory Visit (INDEPENDENT_AMBULATORY_CARE_PROVIDER_SITE_OTHER): Payer: Commercial Indemnity | Admitting: Adult Health

## 2012-09-10 VITALS — BP 108/76 | HR 114 | Temp 98.5°F | Resp 12 | Wt 158.0 lb

## 2012-09-10 DIAGNOSIS — F411 Generalized anxiety disorder: Secondary | ICD-10-CM

## 2012-09-10 MED ORDER — CLONAZEPAM 0.5 MG PO TABS: ORAL_TABLET | ORAL | Status: DC

## 2012-09-10 MED ORDER — FLUOXETINE HCL 20 MG PO TABS: ORAL_TABLET | ORAL | Status: DC

## 2012-09-10 NOTE — Patient Instructions (Addendum)
  Please stop the xanax. I am starting you on klonopin (clonazepam) 0.5 mg twice daily as needed. This is the same type of medication as the xanax except that the klonopin lasts longer. Take twice daily only as needed. If you are having a good day then do not take.  Continue the good job of not smoking. You will feel really good once you conquer this.  I am also increasing your Prozac to 40 mg. You will take 20 mg in the morning and 20 mg in the evening.  Please call if you have any questions or concerns.

## 2012-09-10 NOTE — Progress Notes (Signed)
Subjective:    Patient ID: Debbie Campbell, female    DOB: Feb 11, 1971, 42 y.o.   MRN: [id number]  HPI  Patient is a pleasant 42 year old female with history of anxiety who presents to clinic with increasing episodes of anxiety, shakiness, and inability to sleep after she stopped smoking 3 days ago. She did not start the Chantix. She has been experiencing some shortness of breath with these "panic attacks". She has been using her inhaler which relieved her symptoms. In addition, she has increased stress at work which is not helping her situation. She is trying to deal with this situation as best as possible and tries to participate in distracting activities after work to help her cope. She is currently on Xanax 0.5 mg at bedtime as needed and she is also on Prozac 20 mg which was started in May 2014.   Current Outpatient Prescriptions on File Prior to Visit  Medication Sig Dispense Refill  . albuterol (VENTOLIN HFA) 108 (90 BASE) MCG/ACT inhaler Inhale 1 puff into the lungs every 6 (six) hours as needed for wheezing.  1 Inhaler  11  . butalbital-acetaminophen-caffeine (FIORICET, ESGIC) 50-325-40 MG per tablet Take 1 tablet by mouth 2 (two) times daily as needed for headache.  30 tablet  0  . ondansetron (ZOFRAN-ODT) 4 MG disintegrating tablet Take 4 mg by mouth every 8 (eight) hours as needed.       Marland Kitchen oxyCODONE-acetaminophen (PERCOCET/ROXICET) 5-325 MG per tablet Take 1 tablet by mouth every 6 (six) hours as needed.       . pantoprazole (PROTONIX) 20 MG tablet Take 1 tablet (20 mg total) by mouth daily.  30 tablet  11  . promethazine (PHENERGAN) 25 MG suppository Place 25 mg rectally every 6 (six) hours as needed.       . varenicline (CHANTIX STARTING MONTH PAK) 0.5 MG X 11 & 1 MG X 42 tablet Take one 0.5 mg tablet by mouth once daily for 3 days, then increase to one 0.5 mg tablet twice daily for 4 days, then increase to one 1 mg tablet twice daily.  53 tablet  0   No current  facility-administered medications on file prior to visit.   Review of Systems  Constitutional: Negative.   HENT: Negative.   Eyes: Negative.   Respiratory: Positive for chest tightness, shortness of breath and wheezing.   Cardiovascular: Negative.   Gastrointestinal: Negative.   Psychiatric/Behavioral: Positive for sleep disturbance. Negative for suicidal ideas. The patient is nervous/anxious.   All other systems reviewed and are negative.     BP 108/76  Pulse 114  Temp(Src) 98.5 F (36.9 C) (Oral)  Resp 12  Wt 158 lb (71.668 kg)  BMI 27.75 kg/m2  SpO2 98%    Objective:   Physical Exam  Constitutional: She is oriented to person, place, and time. She appears well-developed and well-nourished.  Tearful throughout visit.  HENT:  Head: Normocephalic and atraumatic.  Cardiovascular: Normal rate, regular rhythm and normal heart sounds.  Exam reveals no gallop.   No murmur heard. Pulmonary/Chest: Effort normal. No respiratory distress. She has wheezes. She has no rales. She exhibits no tenderness.  Mild expiratory wheezing left posterior lobes. Patient recently used her albuterol inhaler.  Neurological: She is alert and oriented to person, place, and time.  Skin: Skin is warm and dry.  Psychiatric: Her behavior is normal. Judgment and thought content normal.  Patient is tearful and crying.          Assessment & Plan:

## 2012-09-10 NOTE — Assessment & Plan Note (Signed)
Patient having increased anxiety which is multifactorial given that she is trying to stop smoking and also increased stressors at work. She has a new supervisor who does not seem to differentiate the "good" employees from the "slackers". Debbie Campbell has never been written up at work and was advised recently that "if she did not clock out at 12:30pm to go to lunch she would be written up". 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 She is understandably upset about this because she feels she was going above and beyond to help a customer and this was not acknowledge. Instead she feels that her new boss is so by the book that she is unable to differentiate or even understand the circumstances that led Debbie Campbell to be late in clocking out for lunch. She is visibly upset during the visit and crying. Discussed that we will stop the xanax and start her on klonopin which is longer lasting. I am also increasing her Prozac to 40 mg. She will take 20 mg in the am and 20 mg in the PM. Debbie Campbell will call with any questions or concerns or if her symptoms do not improve. Provided her with a prescription for klonopin and also a letter to return to work. Note greater than 30 minutes were spent in face to face communication and counseling with patient in the assessment, evaluation and planning pertaining to her symptoms.

## 2012-09-11 ENCOUNTER — Encounter: Payer: Self-pay | Admitting: Adult Health

## 2012-09-14 ENCOUNTER — Other Ambulatory Visit: Payer: Self-pay | Admitting: *Deleted

## 2012-09-14 MED ORDER — ALPRAZOLAM 0.5 MG PO TABS: 0.5000 mg | ORAL_TABLET | Freq: Every evening | ORAL | Status: DC | PRN

## 2012-09-14 NOTE — Telephone Encounter (Signed)
Refill Request  Alprazolam 0.5 mg tab  Take one tablet by mouth at bedtime as needed for sleep

## 2012-09-15 ENCOUNTER — Other Ambulatory Visit: Payer: Self-pay | Admitting: Adult Health

## 2012-09-15 NOTE — Telephone Encounter (Signed)
Rx faxed to pharmacy  

## 2012-09-18 ENCOUNTER — Ambulatory Visit: Payer: Commercial Indemnity | Admitting: Internal Medicine

## 2012-09-22 ENCOUNTER — Encounter: Payer: Self-pay | Admitting: Adult Health

## 2012-09-22 ENCOUNTER — Ambulatory Visit (INDEPENDENT_AMBULATORY_CARE_PROVIDER_SITE_OTHER): Payer: Commercial Indemnity | Admitting: Adult Health

## 2012-09-22 VITALS — BP 116/70 | HR 82 | Temp 98.3°F | Resp 12 | Ht 63.0 in | Wt 157.0 lb

## 2012-09-22 DIAGNOSIS — F411 Generalized anxiety disorder: Secondary | ICD-10-CM

## 2012-09-22 DIAGNOSIS — Z0000 Encounter for general adult medical examination without abnormal findings: Secondary | ICD-10-CM

## 2012-09-22 LAB — LIPID PANEL
HDL: 36.1 mg/dL — ABNORMAL LOW (ref >39.00)
Total CHOL/HDL Ratio: 5
Triglycerides: 207 mg/dL — ABNORMAL HIGH (ref 0.0–149.0)
VLDL: 41.4 mg/dL — ABNORMAL HIGH (ref 0.0–40.0)

## 2012-09-22 LAB — GLUCOSE, RANDOM: Glucose, Bld: 85 mg/dL (ref 70–99)

## 2012-09-22 NOTE — Progress Notes (Signed)
Subjective:    Patient ID: Debbie Campbell, female    DOB: 08/26/1970, 42 y.o.   MRN: [id number]  HPI  Debbie Campbell is a 42 year old female who presents to clinic for a work health screening. She presented to clinic approximately 4 weeks ago stating that her job was requiring nicotine testing to be able to qualify for the lower rate insurance plan. At that time, she was very upset about her job telling her she had to quit smoking otherwise she would have to pay full premium. This actually turned out to be one of the best things that happened to [name]. Because she did not want to pay full price, she went ahead and picked a date to quit smoking. She has been smoke free for 14 days and she reports feeling wonderful. She takes Xanax as needed for anxiety and she reports not having to take it on a regular basis like she was before. She is in very good spirits and excited about what she has accomplished.   Current Outpatient Prescriptions on File Prior to Visit  Medication Sig Dispense Refill  . albuterol (VENTOLIN HFA) 108 (90 BASE) MCG/ACT inhaler Inhale 1 puff into the lungs every 6 (six) hours as needed for wheezing.  1 Inhaler  11  . ALPRAZolam (XANAX) 0.5 MG tablet Take 1 tablet (0.5 mg total) by mouth at bedtime as needed for sleep.  30 tablet  0  . butalbital-acetaminophen-caffeine (FIORICET, ESGIC) 50-325-40 MG per tablet Take 1 tablet by mouth 2 (two) times daily as needed for headache.  30 tablet  0  . FLUoxetine (PROZAC) 20 MG tablet Take 1 tablet in the morning and one in the evening  60 tablet  5  . ondansetron (ZOFRAN-ODT) 4 MG disintegrating tablet Take 4 mg by mouth every 8 (eight) hours as needed.       Marland Kitchen oxyCODONE-acetaminophen (PERCOCET/ROXICET) 5-325 MG per tablet Take 1 tablet by mouth every 6 (six) hours as needed.       . pantoprazole (PROTONIX) 20 MG tablet Take 1 tablet (20 mg total) by mouth daily.  30 tablet  11  . promethazine (PHENERGAN) 25 MG suppository Place 25 mg rectally  every 6 (six) hours as needed.       . varenicline (CHANTIX STARTING MONTH PAK) 0.5 MG X 11 & 1 MG X 42 tablet Take one 0.5 mg tablet by mouth once daily for 3 days, then increase to one 0.5 mg tablet twice daily for 4 days, then increase to one 1 mg tablet twice daily.  53 tablet  0   No current facility-administered medications on file prior to visit.   Review of Systems  Constitutional: Negative.   HENT: Negative.   Eyes: Negative.   Respiratory: Negative.   Cardiovascular: Negative.   Gastrointestinal: Negative.   Genitourinary: Negative.   Musculoskeletal: Negative.   Neurological: Negative.   Psychiatric/Behavioral: Positive for sleep disturbance. The patient is nervous/anxious.   All other systems reviewed and are negative.    BP 116/70  Pulse 82  Temp(Src) 98.3 F (36.8 C) (Oral)  Resp 12  Ht 5\' 3"  (1.6 m)  Wt 157 lb (71.215 kg)  BMI 27.82 kg/m2  SpO2 96%    Objective:   Physical Exam  Constitutional: She is oriented to person, place, and time.  42 year old female in no apparent distress. [name] has lost 20 pounds since May.  HENT:  Head: Normocephalic and atraumatic.  Eyes: Conjunctivae and EOM are normal. Pupils are equal, round, and reactive  to light.  Neck: Normal range of motion. Neck supple. No tracheal deviation present.  Cardiovascular: Normal rate, regular rhythm, normal heart sounds and intact distal pulses.  Exam reveals no gallop and no friction rub.   No murmur heard. Pulmonary/Chest: Effort normal and breath sounds normal. No respiratory distress. She has no wheezes. She has no rales. She exhibits no tenderness.  Abdominal: Soft. Bowel sounds are normal.  Musculoskeletal: Normal range of motion. She exhibits no edema and no tenderness.  Neurological: She is alert and oriented to person, place, and time.  Skin: Skin is warm and dry.  Psychiatric: She has a normal mood and affect. Her behavior is normal. Judgment and thought content normal.           Assessment & Plan:

## 2012-09-22 NOTE — Assessment & Plan Note (Signed)
She is doing very well. Reports having to use xanax occasionally. Continue to follow.

## 2012-09-22 NOTE — Assessment & Plan Note (Signed)
Normal physical exam. Screening required by employer for medical insurance for eligibility for wellness reward. Debbie Campbell quit smoking 14 days ago and has no desire to start back. Congratulated her on her accomplishment and encouraged her to stay on track. Labs: lipids, creatinine, HbA1c, glucose, cotinine. Form to be completed and returned to employer. We will notify Debbie Campbell once form is fully completed.

## 2012-09-22 NOTE — Patient Instructions (Signed)
  Congratulations on your accomplishment of quitting smoking !!!!  You are doing very well.  Please have your lab work done. Once it is available we will contact you to come pick up your form.  Please call with any questions or concerns.

## 2012-09-23 LAB — LDL CHOLESTEROL, DIRECT: Direct LDL: 124.4 mg/dL

## 2012-09-27 ENCOUNTER — Encounter: Payer: Self-pay | Admitting: Adult Health

## 2012-09-28 ENCOUNTER — Other Ambulatory Visit: Payer: Self-pay | Admitting: *Deleted

## 2012-09-28 ENCOUNTER — Encounter: Payer: Self-pay | Admitting: Adult Health

## 2012-09-28 MED ORDER — FLUOXETINE HCL 20 MG PO CAPS: 20.0000 mg | ORAL_CAPSULE | Freq: Two times a day (BID) | ORAL | Status: DC

## 2012-09-28 NOTE — Telephone Encounter (Signed)
Ok to change her Fluoxetine to capsules?

## 2012-09-30 ENCOUNTER — Telehealth: Payer: Self-pay | Admitting: *Deleted

## 2012-09-30 NOTE — Telephone Encounter (Signed)
Message copied by Dema Severin on Wed Sep 30, 2012  9:31 AM ------      Message from: Novella Olive      Created: Tue Sep 29, 2012  1:09 PM       Kriste Basque,      Can you call one of the [facility] Pharmacist and see if they could look up the database to determine if patient has been prescribed dilaudid by other provider?            Thanks,      Raquel ------

## 2012-09-30 NOTE — Telephone Encounter (Signed)
Spoke with pharmacist at [facility], pt has not had Dilaudid filled at any [facility]. States she can not access database to see all pharmacies.

## 2012-10-16 ENCOUNTER — Ambulatory Visit (INDEPENDENT_AMBULATORY_CARE_PROVIDER_SITE_OTHER): Payer: Commercial Indemnity | Admitting: Adult Health

## 2012-10-16 ENCOUNTER — Other Ambulatory Visit (HOSPITAL_COMMUNITY)
Admission: RE | Admit: 2012-10-16 | Discharge: 2012-10-16 | Disposition: A | Discharge status: Home / Self Care | Payer: Commercial Indemnity | Source: Ambulatory Visit | Attending: Internal Medicine | Admitting: Internal Medicine

## 2012-10-16 ENCOUNTER — Encounter: Payer: Self-pay | Admitting: Adult Health

## 2012-10-16 VITALS — BP 106/60 | HR 75 | Resp 12 | Wt 155.0 lb

## 2012-10-16 DIAGNOSIS — Z1239 Encounter for other screening for malignant neoplasm of breast: Secondary | ICD-10-CM

## 2012-10-16 DIAGNOSIS — R87810 Cervical high risk human papillomavirus (HPV) DNA test positive: Secondary | ICD-10-CM | POA: Insufficient documentation

## 2012-10-16 DIAGNOSIS — Z01419 Encounter for gynecological examination (general) (routine) without abnormal findings: Secondary | ICD-10-CM | POA: Insufficient documentation

## 2012-10-16 DIAGNOSIS — Z124 Encounter for screening for malignant neoplasm of cervix: Secondary | ICD-10-CM

## 2012-10-16 DIAGNOSIS — K649 Unspecified hemorrhoids: Secondary | ICD-10-CM | POA: Insufficient documentation

## 2012-10-16 DIAGNOSIS — Z79899 Other long term (current) drug therapy: Secondary | ICD-10-CM

## 2012-10-16 DIAGNOSIS — Z1151 Encounter for screening for human papillomavirus (HPV): Secondary | ICD-10-CM | POA: Insufficient documentation

## 2012-10-16 MED ORDER — HYDROCORTISONE 2.5 % RE CREA: TOPICAL_CREAM | Freq: Two times a day (BID) | RECTAL | Status: AC

## 2012-10-16 MED ORDER — HYDROCORTISONE ACETATE 25 MG RE SUPP: 25.0000 mg | Freq: Two times a day (BID) | RECTAL | Status: AC

## 2012-10-16 MED ORDER — HYDROCORTISONE ACETATE 1 % EX OINT: TOPICAL_OINTMENT | CUTANEOUS | Status: DC

## 2012-10-16 NOTE — Assessment & Plan Note (Signed)
Prescribed anusol suppository and hydrocortisone cream for pain relief.

## 2012-10-16 NOTE — Assessment & Plan Note (Signed)
PAP done. Exam normal.

## 2012-10-16 NOTE — Progress Notes (Signed)
  Subjective:    Patient ID: Debbie Campbell, female    DOB: [birth year], 42 y.o.   MRN: [id number]  HPI  Patient is a pleasant 42 y/o female who presents for her pelvic and breast exam. She is also due for her Mammogram. Physical exam was done earlier in the month including labs. She is feeling well. Today is her birthday and she is planning on celebrating with her boyfriend and family.  Also, discussed recent results of toxicology report.   Current Outpatient Prescriptions on File Prior to Visit  Medication Sig Dispense Refill  . albuterol (VENTOLIN HFA) 108 (90 BASE) MCG/ACT inhaler Inhale 1 puff into the lungs every 6 (six) hours as needed for wheezing.  1 Inhaler  11  . ALPRAZolam (XANAX) 0.5 MG tablet Take 1 tablet (0.5 mg total) by mouth at bedtime as needed for sleep.  30 tablet  0  . butalbital-acetaminophen-caffeine (FIORICET, ESGIC) 50-325-40 MG per tablet Take 1 tablet by mouth 2 (two) times daily as needed for headache.  30 tablet  0  . FLUoxetine (PROZAC) 20 MG capsule Take 1 capsule (20 mg total) by mouth 2 (two) times daily.  60 capsule  5  . ondansetron (ZOFRAN-ODT) 4 MG disintegrating tablet Take 4 mg by mouth every 8 (eight) hours as needed.       . pantoprazole (PROTONIX) 20 MG tablet Take 1 tablet (20 mg total) by mouth daily.  30 tablet  11  . promethazine (PHENERGAN) 25 MG suppository Place 25 mg rectally every 6 (six) hours as needed.        No current facility-administered medications on file prior to visit.     Review of Systems  Constitutional: Negative.   HENT: Negative.   Respiratory: Negative.   Cardiovascular: Negative.   Gastrointestinal:       Painful hemorrhoid   Genitourinary: Negative.   Musculoskeletal: Negative.   Neurological: Negative.   Psychiatric/Behavioral: Negative.   All other systems reviewed and are negative.       Objective:   Physical Exam  Constitutional: She appears well-developed and well-nourished.  Cardiovascular:  Normal rate and regular rhythm.   Pulmonary/Chest: Effort normal. No respiratory distress.  Abdominal: Soft. Bowel sounds are normal. Hernia confirmed negative in the right inguinal area and confirmed negative in the left inguinal area.  Genitourinary: Vagina normal and uterus normal. Rectal exam shows external hemorrhoid. No breast swelling, tenderness, discharge or bleeding. There is no rash, tenderness, lesion or injury on the right labia. There is no rash, tenderness, lesion or injury on the left labia. Cervix exhibits no motion tenderness, no discharge and no friability. Right adnexum displays no mass, no tenderness and no fullness. Left adnexum displays no mass, no tenderness and no fullness. No erythema, tenderness or bleeding around the vagina. No foreign body around the vagina. No signs of injury around the vagina. No vaginal discharge found.  Lymphadenopathy:       Right: No inguinal adenopathy present.       Left: No inguinal adenopathy present.          Assessment & Plan:

## 2012-10-16 NOTE — Patient Instructions (Addendum)
  We will notify you of the PAP results once they are available.  For your hemorrhoids:   Use the suppository to decrease internal swelling   Use the external cream for the pain and discomfort  Take stool softener. You can also take miralax. Mix with 8 oz of your fluid of choice. Drink plenty of water throughout the day.  Schedule your mammogram.

## 2012-10-16 NOTE — Assessment & Plan Note (Signed)
Screening mammogram ordered. Patient will self schedule.

## 2012-10-16 NOTE — Assessment & Plan Note (Signed)
Discussion regarding recent toxicology screen. Patient tested appropriately for medications prescribed. However, she also tested positive for dilaudid which has not been prescribed. Patient reports that she was given dilaudid during ED visit 08/11/12. Explained that this would not show up in her urine > 1 month after. She will be placed on the moderate risk and be re-tested randomly. She is in agreement.

## 2012-10-22 ENCOUNTER — Other Ambulatory Visit: Payer: Self-pay | Admitting: Adult Health

## 2012-10-24 ENCOUNTER — Other Ambulatory Visit: Payer: Self-pay | Admitting: Adult Health

## 2012-10-25 ENCOUNTER — Other Ambulatory Visit: Payer: Self-pay | Admitting: Adult Health

## 2012-10-25 ENCOUNTER — Encounter: Payer: Self-pay | Admitting: Internal Medicine

## 2012-10-26 ENCOUNTER — Encounter: Payer: Self-pay | Admitting: Internal Medicine

## 2012-10-26 NOTE — Telephone Encounter (Signed)
Okay to refill? HAs cancelled last 3 appts with you. Has seen Raquel a few times recently

## 2012-10-27 ENCOUNTER — Telehealth: Payer: Self-pay | Admitting: Internal Medicine

## 2012-10-27 NOTE — Telephone Encounter (Signed)
Rx was re faxed to [facility].

## 2012-10-27 NOTE — Telephone Encounter (Signed)
ALPRAZolam (XANAX) 0.5 MG tablet  Has not been received at the pharmacy could you please fax it again per the patient.   Fax #[phone]

## 2012-10-27 NOTE — Telephone Encounter (Signed)
Rx refaxed to [facility]. Pt notified thru myChart

## 2012-11-02 NOTE — Telephone Encounter (Signed)
I'm pretty sure we sent one in recently. Can you check?

## 2012-11-02 NOTE — Telephone Encounter (Signed)
Rx has already been completed.

## 2012-11-02 NOTE — Telephone Encounter (Signed)
Already sent in

## 2012-11-11 ENCOUNTER — Other Ambulatory Visit: Payer: Self-pay | Admitting: Adult Health

## 2012-11-12 NOTE — Telephone Encounter (Signed)
yes

## 2012-11-12 NOTE — Telephone Encounter (Signed)
Ok to refill 

## 2012-11-13 NOTE — Telephone Encounter (Signed)
Rx faxed to pharmacy  

## 2012-11-19 ENCOUNTER — Emergency Department: Payer: Self-pay | Admitting: Emergency Medicine

## 2012-11-19 LAB — DRUG SCREEN, URINE
Amphetamines, Ur Screen: POSITIVE (ref ?–1000)
Barbiturates, Ur Screen: NEGATIVE (ref ?–200)
Cannabinoid 50 Ng, Ur ~~LOC~~: POSITIVE (ref ?–50)
Cocaine Metabolite,Ur ~~LOC~~: NEGATIVE (ref ?–300)
Phencyclidine (PCP) Ur S: NEGATIVE (ref ?–25)

## 2012-11-19 LAB — COMPREHENSIVE METABOLIC PANEL
Albumin: 4.1 g/dL (ref 3.4–5.0)
Bilirubin,Total: 0.3 mg/dL (ref 0.2–1.0)
Calcium, Total: 9.4 mg/dL (ref 8.5–10.1)
Co2: 25 mmol/L (ref 21–32)
Creatinine: 0.69 mg/dL (ref 0.60–1.30)
EGFR (African American): >60
EGFR (Non-African Amer.): >60
Glucose: 118 mg/dL — ABNORMAL HIGH (ref 65–99)
Osmolality: 272 (ref 275–301)
Potassium: 3.4 mmol/L — ABNORMAL LOW (ref 3.5–5.1)
SGOT(AST): 14 U/L — ABNORMAL LOW (ref 15–37)
SGPT (ALT): 22 U/L (ref 12–78)
Sodium: 136 mmol/L (ref 136–145)

## 2012-11-19 LAB — CBC
HCT: 42.1 % (ref 35.0–47.0)
HGB: 14.1 g/dL (ref 12.0–16.0)
MCH: 30.4 pg (ref 26.0–34.0)
MCHC: 33.4 g/dL (ref 32.0–36.0)
MCV: 91 fL (ref 80–100)
Platelet: 333 10*3/uL (ref 150–440)
RBC: 4.63 10*6/uL (ref 3.80–5.20)
RDW: 12.3 % (ref 11.5–14.5)
WBC: 14.7 10*3/uL — ABNORMAL HIGH (ref 3.6–11.0)

## 2012-11-19 LAB — URINALYSIS, COMPLETE
Bilirubin,UR: NEGATIVE
Blood: NEGATIVE
Leukocyte Esterase: NEGATIVE
Nitrite: NEGATIVE
Ph: 5 (ref 4.5–8.0)
Protein: 30
RBC,UR: 12 /HPF (ref 0–5)
Specific Gravity: 1.028 (ref 1.003–1.030)
Squamous Epithelial: 12

## 2012-11-19 LAB — LIPASE, BLOOD: Lipase: 105 U/L (ref 73–393)

## 2012-11-23 ENCOUNTER — Other Ambulatory Visit: Payer: Self-pay | Admitting: Adult Health

## 2012-11-23 NOTE — Telephone Encounter (Signed)
Ok to refill 

## 2012-11-24 NOTE — Telephone Encounter (Signed)
Rx faxed to pharmacy  

## 2012-11-28 ENCOUNTER — Emergency Department: Payer: Self-pay | Admitting: Emergency Medicine

## 2012-11-28 LAB — COMPREHENSIVE METABOLIC PANEL
Calcium, Total: 9.7 mg/dL (ref 8.5–10.1)
Chloride: 108 mmol/L — ABNORMAL HIGH (ref 98–107)
Co2: 22 mmol/L (ref 21–32)
Creatinine: 0.78 mg/dL (ref 0.60–1.30)
EGFR (African American): >60
EGFR (Non-African Amer.): >60
Glucose: 183 mg/dL — ABNORMAL HIGH (ref 65–99)
Osmolality: 280 (ref 275–301)
Potassium: 3.7 mmol/L (ref 3.5–5.1)
SGOT(AST): 30 U/L (ref 15–37)
SGPT (ALT): 34 U/L (ref 12–78)
Sodium: 138 mmol/L (ref 136–145)

## 2012-11-28 LAB — URINALYSIS, COMPLETE
Bilirubin,UR: NEGATIVE
Glucose,UR: >=500 mg/dL (ref 0–75)
Ph: 9 (ref 4.5–8.0)
RBC,UR: 5 /HPF (ref 0–5)
WBC UR: 2 /HPF (ref 0–5)

## 2012-11-28 LAB — CBC
HCT: 41.1 % (ref 35.0–47.0)
HGB: 13.9 g/dL (ref 12.0–16.0)
MCHC: 33.9 g/dL (ref 32.0–36.0)
MCV: 91 fL (ref 80–100)
Platelet: 451 10*3/uL — ABNORMAL HIGH (ref 150–440)
RBC: 4.5 10*6/uL (ref 3.80–5.20)
RDW: 13 % (ref 11.5–14.5)

## 2012-11-28 LAB — LIPASE, BLOOD: Lipase: 99 U/L (ref 73–393)

## 2012-12-04 ENCOUNTER — Encounter: Payer: Self-pay | Admitting: Adult Health

## 2012-12-08 ENCOUNTER — Ambulatory Visit: Payer: Commercial Indemnity | Admitting: Adult Health

## 2012-12-08 ENCOUNTER — Telehealth: Payer: Self-pay | Admitting: Internal Medicine

## 2012-12-08 NOTE — Telephone Encounter (Signed)
Pt dropped off paperwork to be filled out.  Placed in [employer].  Call home or work when complete.

## 2012-12-09 ENCOUNTER — Encounter: Payer: Self-pay | Admitting: *Deleted

## 2012-12-09 ENCOUNTER — Encounter: Payer: Self-pay | Admitting: Adult Health

## 2012-12-09 NOTE — Telephone Encounter (Signed)
No I haven't seen them. She was recently seen by Raquel.

## 2012-12-09 NOTE — Telephone Encounter (Signed)
Have you seen these papers?

## 2012-12-10 NOTE — Telephone Encounter (Signed)
To Raquel, Did you get the paperwork for her? If so please call her when ready

## 2012-12-11 ENCOUNTER — Ambulatory Visit (INDEPENDENT_AMBULATORY_CARE_PROVIDER_SITE_OTHER): Payer: Commercial Indemnity | Admitting: Adult Health

## 2012-12-11 ENCOUNTER — Encounter: Payer: Self-pay | Admitting: Adult Health

## 2012-12-11 VITALS — BP 116/70 | HR 110 | Temp 98.3°F | Resp 14 | Wt 148.0 lb

## 2012-12-11 DIAGNOSIS — R109 Unspecified abdominal pain: Secondary | ICD-10-CM | POA: Insufficient documentation

## 2012-12-11 NOTE — Assessment & Plan Note (Addendum)
Patient seen in clinic for followup ED visit on 11/28/2012 for abdominal pain.? Colitis. Referral to GI. Patient has had 3 episodes this year. No current symptoms. Patient feeling well. Note greater than 30 minutes spent in filling out FMLA papers, and reviewing ED notes.

## 2012-12-11 NOTE — Progress Notes (Signed)
  Subjective:    Patient ID: Debbie Campbell, female    DOB: 02-28-1970, 42 y.o.   MRN: [id number]  HPI  Patient is a pleasant 42 year old female who presents to clinic status post ED visit on 11/28/2012 for abdominal pain. CT scan showed thickening of the descending and transverse colon. ? Colitis. She was not admitted. Patient reports that she was out of work on Monday but quickly returned on Tuesday. She presents to clinic for FMLA papers that her employer is requiring. As far as her symptoms, she is feeling much better. She does report that she has had similar episodes this year. Total episodes approximately 3 this year. Denies blood in her stool, ongoing abdominal pain, fever. Labs done in the ED were normal.  Current Outpatient Prescriptions on File Prior to Visit  Medication Sig Dispense Refill  . albuterol (VENTOLIN HFA) 108 (90 BASE) MCG/ACT inhaler Inhale 1 puff into the lungs every 6 (six) hours as needed for wheezing.  1 Inhaler  11  . ALPRAZolam (XANAX) 0.5 MG tablet TAKE ONE TABLET BY MOUTH AT BEDTIME AS NEEDED FOR SLEEP  30 tablet  0  . butalbital-acetaminophen-caffeine (FIORICET, ESGIC) 50-325-40 MG per tablet TAKE ONE TABLET BY MOUTH TWICE DAILY AS NEEDED FOR HEADACHE  30 tablet  0  . FLUoxetine (PROZAC) 20 MG capsule Take 1 capsule (20 mg total) by mouth 2 (two) times daily.  60 capsule  5  . hydrocortisone (ANUCORT-HC) 25 MG suppository Place 1 suppository (25 mg total) rectally 2 (two) times daily.  12 suppository  0  . hydrocortisone (ANUSOL-HC) 2.5 % rectal cream Place rectally 2 (two) times daily.  30 g  0  . ondansetron (ZOFRAN-ODT) 4 MG disintegrating tablet Take 4 mg by mouth every 8 (eight) hours as needed.       . pantoprazole (PROTONIX) 20 MG tablet Take 1 tablet (20 mg total) by mouth daily.  30 tablet  11  . promethazine (PHENERGAN) 25 MG suppository Place 25 mg rectally every 6 (six) hours as needed.        No current facility-administered medications on file  prior to visit.      Review of Systems  Constitutional: Negative.   HENT: Negative.   Respiratory: Negative.   Cardiovascular: Negative.   Gastrointestinal: Negative.   Genitourinary: Negative.   Neurological: Negative.   Psychiatric/Behavioral: Negative.        Objective:   Physical Exam  Constitutional: She is oriented to person, place, and time. She appears well-developed and well-nourished. No distress.  Cardiovascular: Normal rate and regular rhythm.   Pulmonary/Chest: Effort normal. No respiratory distress.  Abdominal: Soft. Bowel sounds are normal.  Musculoskeletal: Normal range of motion.  Neurological: She is alert and oriented to person, place, and time.  Skin: Skin is warm and dry.  Psychiatric: She has a normal mood and affect. Her behavior is normal. Judgment and thought content normal.          Assessment & Plan:

## 2012-12-16 ENCOUNTER — Other Ambulatory Visit: Payer: Self-pay | Admitting: Adult Health

## 2012-12-16 NOTE — Telephone Encounter (Signed)
Faxed to pharmacy

## 2012-12-16 NOTE — Telephone Encounter (Signed)
yes

## 2012-12-16 NOTE — Telephone Encounter (Signed)
Refill

## 2012-12-22 ENCOUNTER — Other Ambulatory Visit: Payer: Self-pay | Admitting: Adult Health

## 2012-12-22 NOTE — Telephone Encounter (Signed)
Ok to refill 

## 2012-12-22 NOTE — Telephone Encounter (Signed)
yes

## 2012-12-23 NOTE — Telephone Encounter (Signed)
Rx did not print, phoned to pharmacy.

## 2012-12-24 ENCOUNTER — Other Ambulatory Visit: Payer: Self-pay

## 2013-01-06 ENCOUNTER — Encounter: Payer: Self-pay | Admitting: Adult Health

## 2013-01-06 NOTE — Telephone Encounter (Signed)
Last refill 12/22/12 #30. Pt sent myChart message requesting refills.

## 2013-01-11 ENCOUNTER — Ambulatory Visit (INDEPENDENT_AMBULATORY_CARE_PROVIDER_SITE_OTHER): Payer: Commercial Indemnity | Admitting: Adult Health

## 2013-01-11 ENCOUNTER — Encounter: Payer: Self-pay | Admitting: Adult Health

## 2013-01-11 VITALS — BP 112/70 | HR 92 | Temp 98.5°F | Resp 12 | Wt 141.5 lb

## 2013-01-11 DIAGNOSIS — F411 Generalized anxiety disorder: Secondary | ICD-10-CM

## 2013-01-11 MED ORDER — ALPRAZOLAM 0.5 MG PO TABS: 0.5000 mg | ORAL_TABLET | Freq: Two times a day (BID) | ORAL | Status: AC | PRN

## 2013-01-11 NOTE — Assessment & Plan Note (Signed)
Discussed temporary increasing Xanax to twice a day. Goal is to come off Xanax completely or just use as needed. Discussed this at length. Also discussed option of increasing prozac. RTC for follow up in 1 month.

## 2013-01-11 NOTE — Progress Notes (Signed)
  Subjective:    Patient ID: Bobbie Stack, female    DOB: 10/14/1970, 42 y.o.   MRN: [id number]  HPI  Patient is a pleasant 42 year old female who presents to clinic with increasing anxiety. She reports that she has been having problems since the death of her father. Symptoms are worsened by stress at work. She currently has Xanax 0.5 mg at bedtime as needed. She is requesting a short term increase of her anxiety medication. She is also on Prozac 20 mg twice a day.   Current Outpatient Prescriptions on File Prior to Visit  Medication Sig Dispense Refill  . albuterol (VENTOLIN HFA) 108 (90 BASE) MCG/ACT inhaler Inhale 1 puff into the lungs every 6 (six) hours as needed for wheezing.  1 Inhaler  11  . butalbital-acetaminophen-caffeine (FIORICET, ESGIC) 50-325-40 MG per tablet TAKE ONE TABLET BY MOUTH TWICE DAILY AS NEEDED FOR HEADACHE  30 tablet  2  . FLUoxetine (PROZAC) 20 MG capsule Take 1 capsule (20 mg total) by mouth 2 (two) times daily.  60 capsule  5  . ondansetron (ZOFRAN-ODT) 4 MG disintegrating tablet Take 4 mg by mouth every 8 (eight) hours as needed.       . pantoprazole (PROTONIX) 20 MG tablet Take 1 tablet (20 mg total) by mouth daily.  30 tablet  11  . promethazine (PHENERGAN) 25 MG suppository Place 25 mg rectally every 6 (six) hours as needed.       . hydrocortisone (ANUCORT-HC) 25 MG suppository Place 1 suppository (25 mg total) rectally 2 (two) times daily.  12 suppository  0  . hydrocortisone (ANUSOL-HC) 2.5 % rectal cream Place rectally 2 (two) times daily.  30 g  0   No current facility-administered medications on file prior to visit.    Review of Systems  Psychiatric/Behavioral: Positive for sleep disturbance. Negative for behavioral problems, confusion, self-injury, decreased concentration and agitation. The patient is nervous/anxious.        Objective:   Physical Exam  Constitutional: She is oriented to person, place, and time. She appears well-developed and  well-nourished.  Appears emotionally distressed  Cardiovascular: Normal rate, regular rhythm and normal heart sounds.  Exam reveals no gallop.   No murmur heard. Pulmonary/Chest: Effort normal and breath sounds normal. No respiratory distress.  Neurological: She is alert and oriented to person, place, and time.  Psychiatric:  Patient is crying. She is encountering increased episodes of anxiety. Has a new boss at work and has added stress          Assessment & Plan:

## 2013-01-11 NOTE — Progress Notes (Signed)
Pre visit review using our clinic review tool, if applicable. No additional management support is needed unless otherwise documented below in the visit note. 

## 2013-02-01 ENCOUNTER — Other Ambulatory Visit: Payer: Self-pay | Admitting: Adult Health

## 2013-02-01 NOTE — Telephone Encounter (Signed)
She will need to wait until Raquel returns for Rx. I will not write controlled drugs for her given previous abnormal UDS.

## 2013-02-01 NOTE — Addendum Note (Signed)
Addended by: Chandra Batch E on: 02/01/2013 11:58 AM   Modules accepted: Orders

## 2013-02-01 NOTE — Telephone Encounter (Signed)
Ok refill? 

## 2013-02-02 ENCOUNTER — Encounter: Payer: Self-pay | Admitting: Adult Health

## 2013-02-02 NOTE — Telephone Encounter (Signed)
She needs to be seen for refills as  1. This refill is early 2. Her previous urine drug screen was positive for multiple drugs that she is not prescribed, and I will not write her for controlled drugs. I would recommend that she follow up with Raquel.

## 2013-02-02 NOTE — Telephone Encounter (Signed)
I had advised her Debbie Campbell would need to complete the refill when she returned. Then she sent this myChart message today. Anything else I should tell her? I spoke with [facility], they had last refilled Fiorcet 01/19/13 #30

## 2013-02-09 ENCOUNTER — Other Ambulatory Visit: Payer: Self-pay | Admitting: Adult Health

## 2013-02-09 NOTE — Telephone Encounter (Signed)
Ok refill? 

## 2013-02-19 ENCOUNTER — Ambulatory Visit
Admission: RE | Admit: 2013-02-19 | Discharge: 2013-02-19 | Disposition: A | Discharge status: Home / Self Care | Payer: Commercial Indemnity | Source: Ambulatory Visit | Attending: Adult Health | Admitting: Adult Health

## 2013-02-19 ENCOUNTER — Encounter: Payer: Self-pay | Admitting: Adult Health

## 2013-02-19 DIAGNOSIS — Z1239 Encounter for other screening for malignant neoplasm of breast: Secondary | ICD-10-CM

## 2013-02-22 NOTE — Telephone Encounter (Signed)
Mailed unread message to pt  

## 2013-05-04 ENCOUNTER — Other Ambulatory Visit: Payer: Self-pay | Admitting: Adult Health

## 2013-08-18 ENCOUNTER — Other Ambulatory Visit: Payer: Self-pay | Admitting: Adult Health

## 2013-08-19 NOTE — Telephone Encounter (Signed)
Last refill 5.28.15, last OV 11.24.14, no future appoints scheduled.   Please advise refill.

## 2013-08-29 ENCOUNTER — Other Ambulatory Visit: Payer: Self-pay | Admitting: Adult Health

## 2014-02-10 ENCOUNTER — Other Ambulatory Visit: Payer: Self-pay | Admitting: Internal Medicine

## 2014-02-10 ENCOUNTER — Ambulatory Visit
Admission: RE | Admit: 2014-02-10 | Discharge: 2014-02-10 | Disposition: A | Discharge status: Home / Self Care | Payer: 59 | Source: Ambulatory Visit | Attending: Internal Medicine | Admitting: Internal Medicine

## 2014-02-10 DIAGNOSIS — M25512 Pain in left shoulder: Secondary | ICD-10-CM

## 2014-05-25 ENCOUNTER — Other Ambulatory Visit: Payer: Self-pay

## 2014-05-25 DIAGNOSIS — Z1231 Encounter for screening mammogram for malignant neoplasm of breast: Secondary | ICD-10-CM

## 2014-05-30 ENCOUNTER — Ambulatory Visit: Payer: 59

## 2014-06-10 NOTE — Op Note (Signed)
PATIENT NAME:  Debbie Campbell, Debbie Campbell MR#:  [id number] DATE OF BIRTH:  Dec 23, 1970  DATE OF PROCEDURE:  05/11/2012  PREOPERATIVE DIAGNOSIS: Dysmenorrhea.   POSTOPERATIVE DIAGNOSIS: Dysmenorrhea, moderate adhesive disease.   PROCEDURES: LSH and moderate adhesiolysis.   SURGEON: Ricky L. Amalia Hailey, M.D.   ASSISTANT: Laverta Baltimore, M.D.   ANESTHESIA: General endotracheal.   FINDINGS: Grossly normal uterus. Tubes were status post ligation using Hulka clips bilaterally. There were adhesions along the right abdominal wall extending from the right adnexa to the liver. There were adhesions of the omentum to the left fallopian tube, presumably status post appendectomy, adhesions to the anterior abdominal wall. Excellent hemostasis, cosmesis.   ESTIMATED BLOOD LOSS: 50.   COMPLICATIONS: None.   SPECIMENS: Uterus.   DRAINS: Foley catheter intraoperatively, which was removed at the end of the case.   ANTIBIOTICS: 2 grams of Ancef preop.   PROCEDURE IN DETAIL: The patient was consented and taken to the operating room and placed in the supine position where anesthesia was initiated, placed in the dorsal lithotomy position using Allen stirrups, prepped and draped in the usual sterile fashion. The cervix was visualized and grasped with a single-tooth Hulka. Sound was placed without difficulty, and these were Steri-Stripped together. Foley catheter was placed. Next, we turned our attention to the abdomen.   An infraumbilical incision vertically was made with a #15 blade, and a #11 port was inserted without difficulty. Pneumoperitoneum was established with findings of adhesions as noted above.   Under direct visualization, a #11 port was placed in the left lower quadrant and through this, using a Harmonic scalpel, I was able to dissect free the omentum from the anterior abdominal wall, taking care to ensure that no bowel was included, so we could visualize the right lower quadrant. Once we were able to  visualize the right lower quadrant, a #11 port was placed in the right lower quadrant under direct visualization.   The uterus was deviated to the right using a grasper. We went inside the omental adhesion to divide the round ligament and tube and the utero-ovarian from the uterus, and then the left broad ligament was developed and the right uterine artery was cauterized with Kleppinger's, and then a Harmonic scalpel was used to develop the left half of the supracervical dissection.   Then, switched force, and the uterus was retracted to the left and again came inside the adhesions and developed the right adnexa similarly to as described on the left and cauterized the right uterine, and a cervical stump was created. The cervical canal was cauterized with hand of the operator in the vagina to ensure no upper vaginal trauma.   The left lower quadrant port was switched out for a morcellator, and the uterus was removed in morcellator fashion without difficulty.   The pelvis was copiously irrigated. We spent approximately 12 additional minutes dissecting free the right omentum. Adhesions became quite dense, and we began to get into some bleeding and at this point felt that the procedure had achieved maximum efficacy. Areas were irrigated. Pressure was lowered to 6 mmHg. Areas were seen to be hemostatic. The pneumoperitoneum was allowed to resolve. Ports were removed. Incisions were closed with deep of 0, subcu with 3-0 Vicryl. Steri-Strips and Band-Aids were applied.   The patient tolerated the procedure well. Foley was removed at the end of the case. I anticipate a routine postoperative course.   30 mL of 0.5 Sensorcaine was used in three 10 mL aliquots prior to port placements.  ____________________________ Rockey Situ. Amalia Hailey, MD rle:lo D: 05/11/2012 11:46:39 ET T: 05/11/2012 12:25:06 ET JOB#: [id number]  cc: Ricky L. Amalia Hailey, MD, <Dictator> Selmer Dominion MD ELECTRONICALLY SIGNED 05/11/2012 14:11

## 2014-08-02 ENCOUNTER — Other Ambulatory Visit: Payer: Self-pay | Admitting: *Deleted

## 2014-10-05 IMAGING — CR DG CHEST 2V
1 series · 2 of 2 positions shown · non-contrast
Comparison: none

REASON FOR EXAM: pain with cough
COMMENTS:

PROCEDURE:     DXR - DXR CHEST PA (OR AP) AND LATERAL  - April 21, 2012 [DATE]
RESULT:     Comparison: None

[Series 1: w chest pa · 0.14mm/px · 2 of 2 slices shown]
[im 1/2]
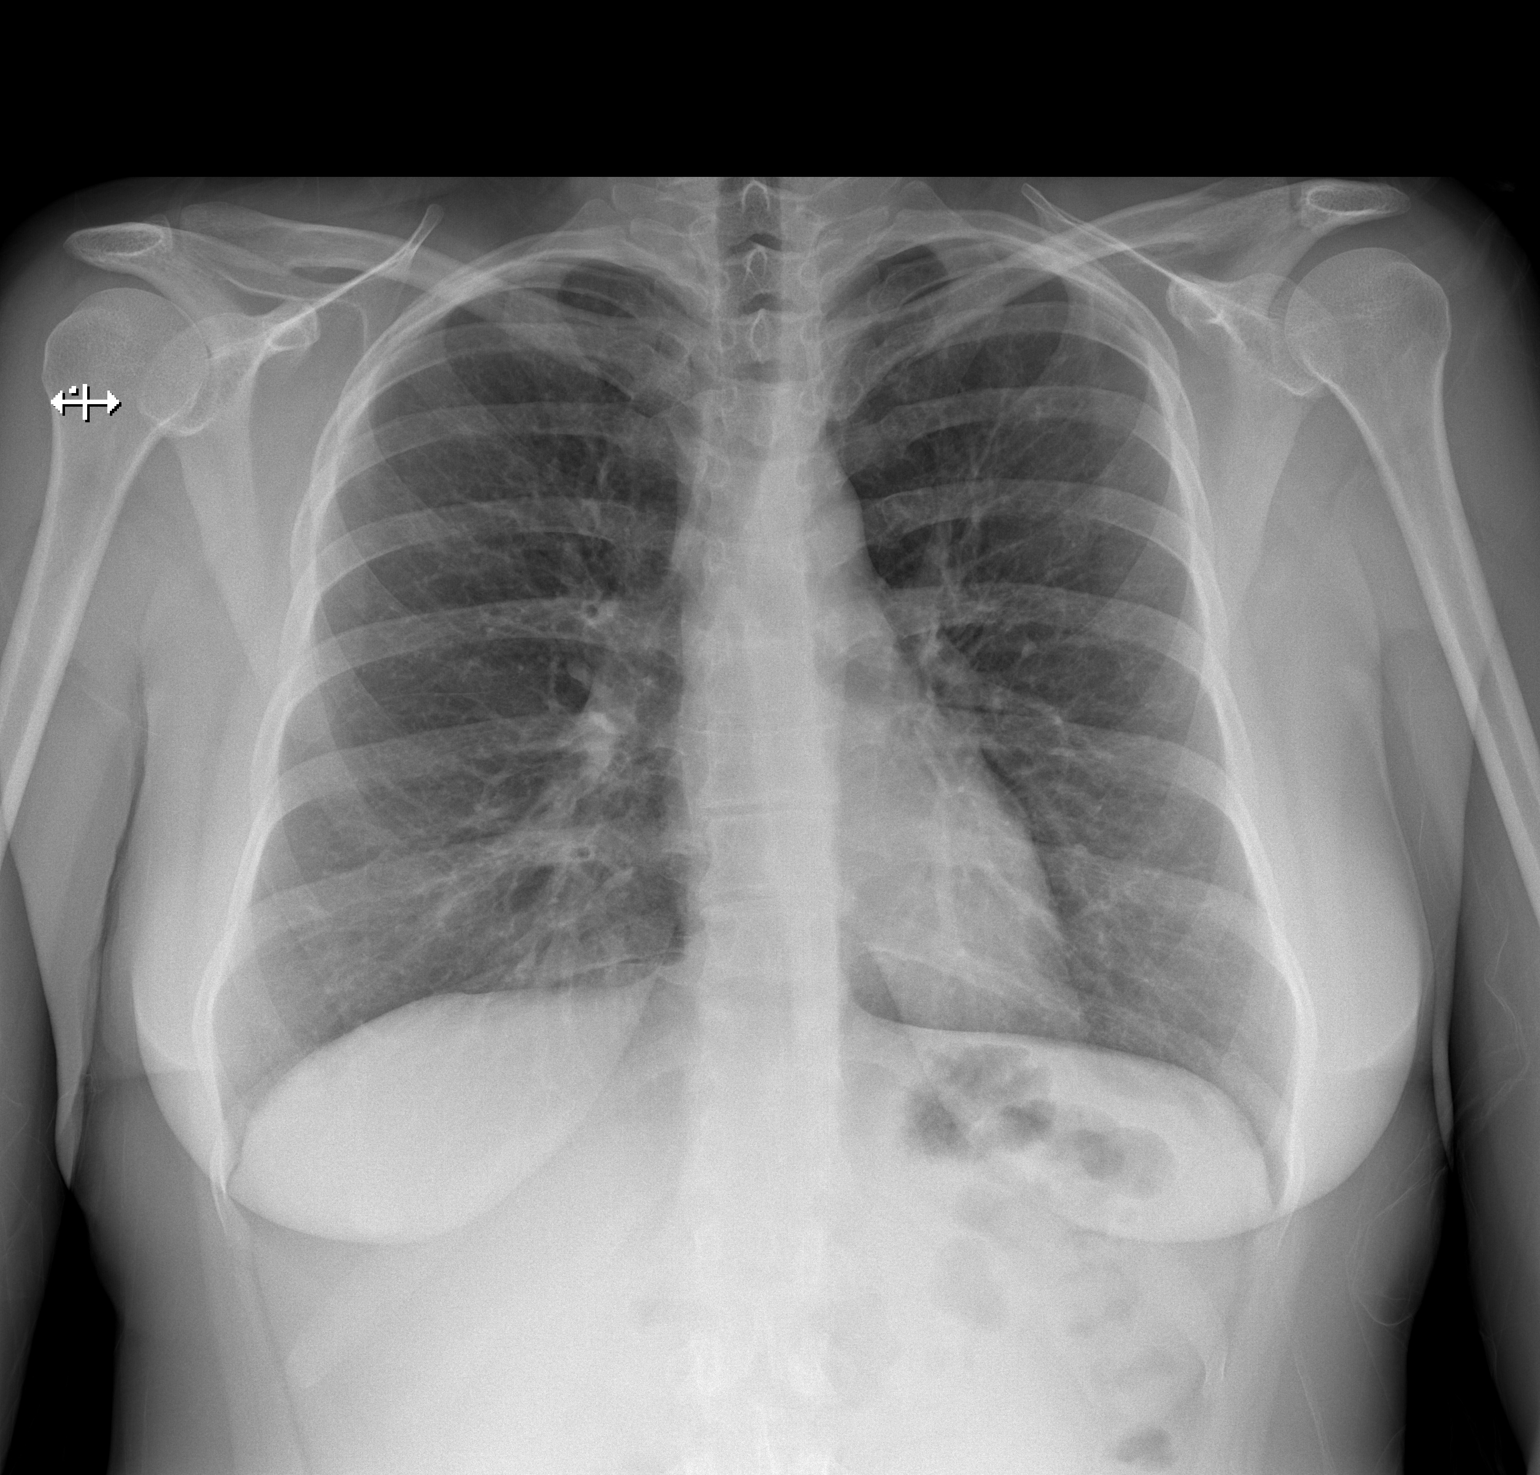
[im 2/2]
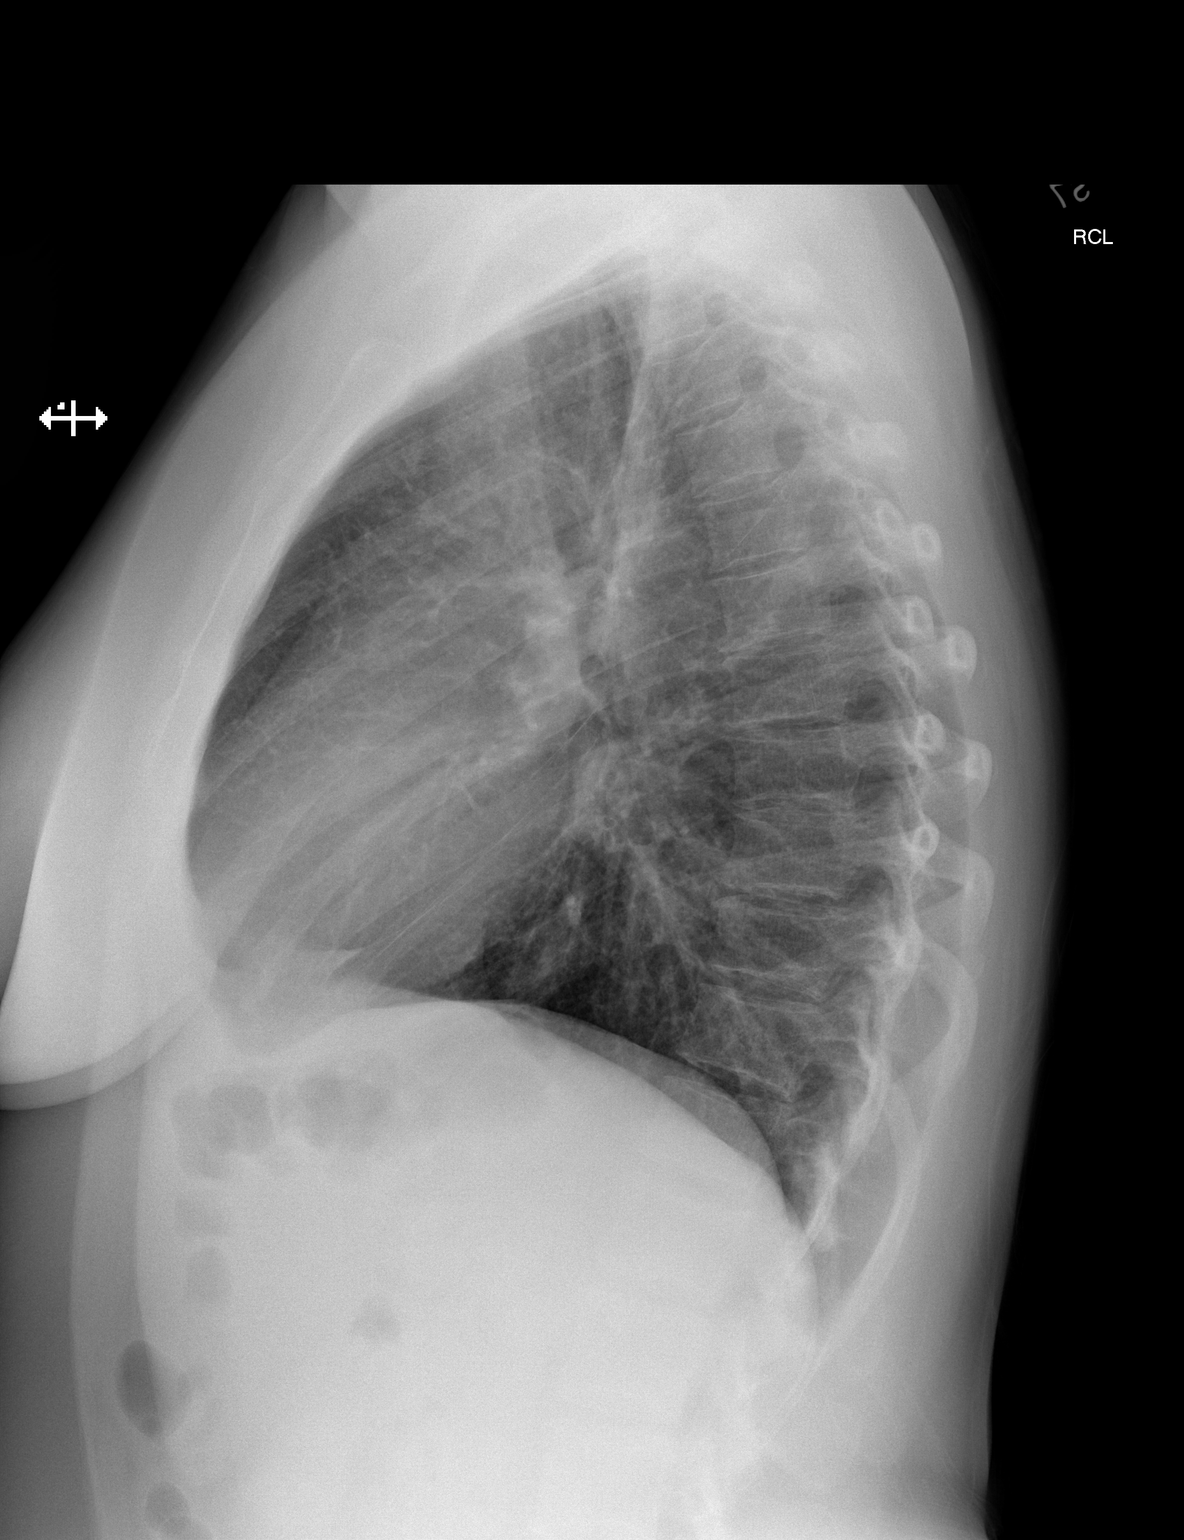

[2 of 2 positions shown; findings below may reference images not displayed]

FINDINGS: PA and lateral chest radiographs are provided.  There is no focal
parenchymal opacity, pleural effusion, or pneumothorax. The heart and
mediastinum are unremarkable.  The osseous structures are unremarkable.
IMPRESSION: No acute disease of the che[REDACTED]

## 2016-07-26 IMAGING — CR DG SHOULDER 2+V*L*
3 series · 3 of 3 positions shown · non-contrast
Comparison: 08/20/2010 chest x-ray.

CLINICAL DATA: 43-year-old female pain for 3 weeks.  No trauma.

EXAM:
LEFT SHOULDER - 2+ VIEW

[view not recorded (1 of 3)]
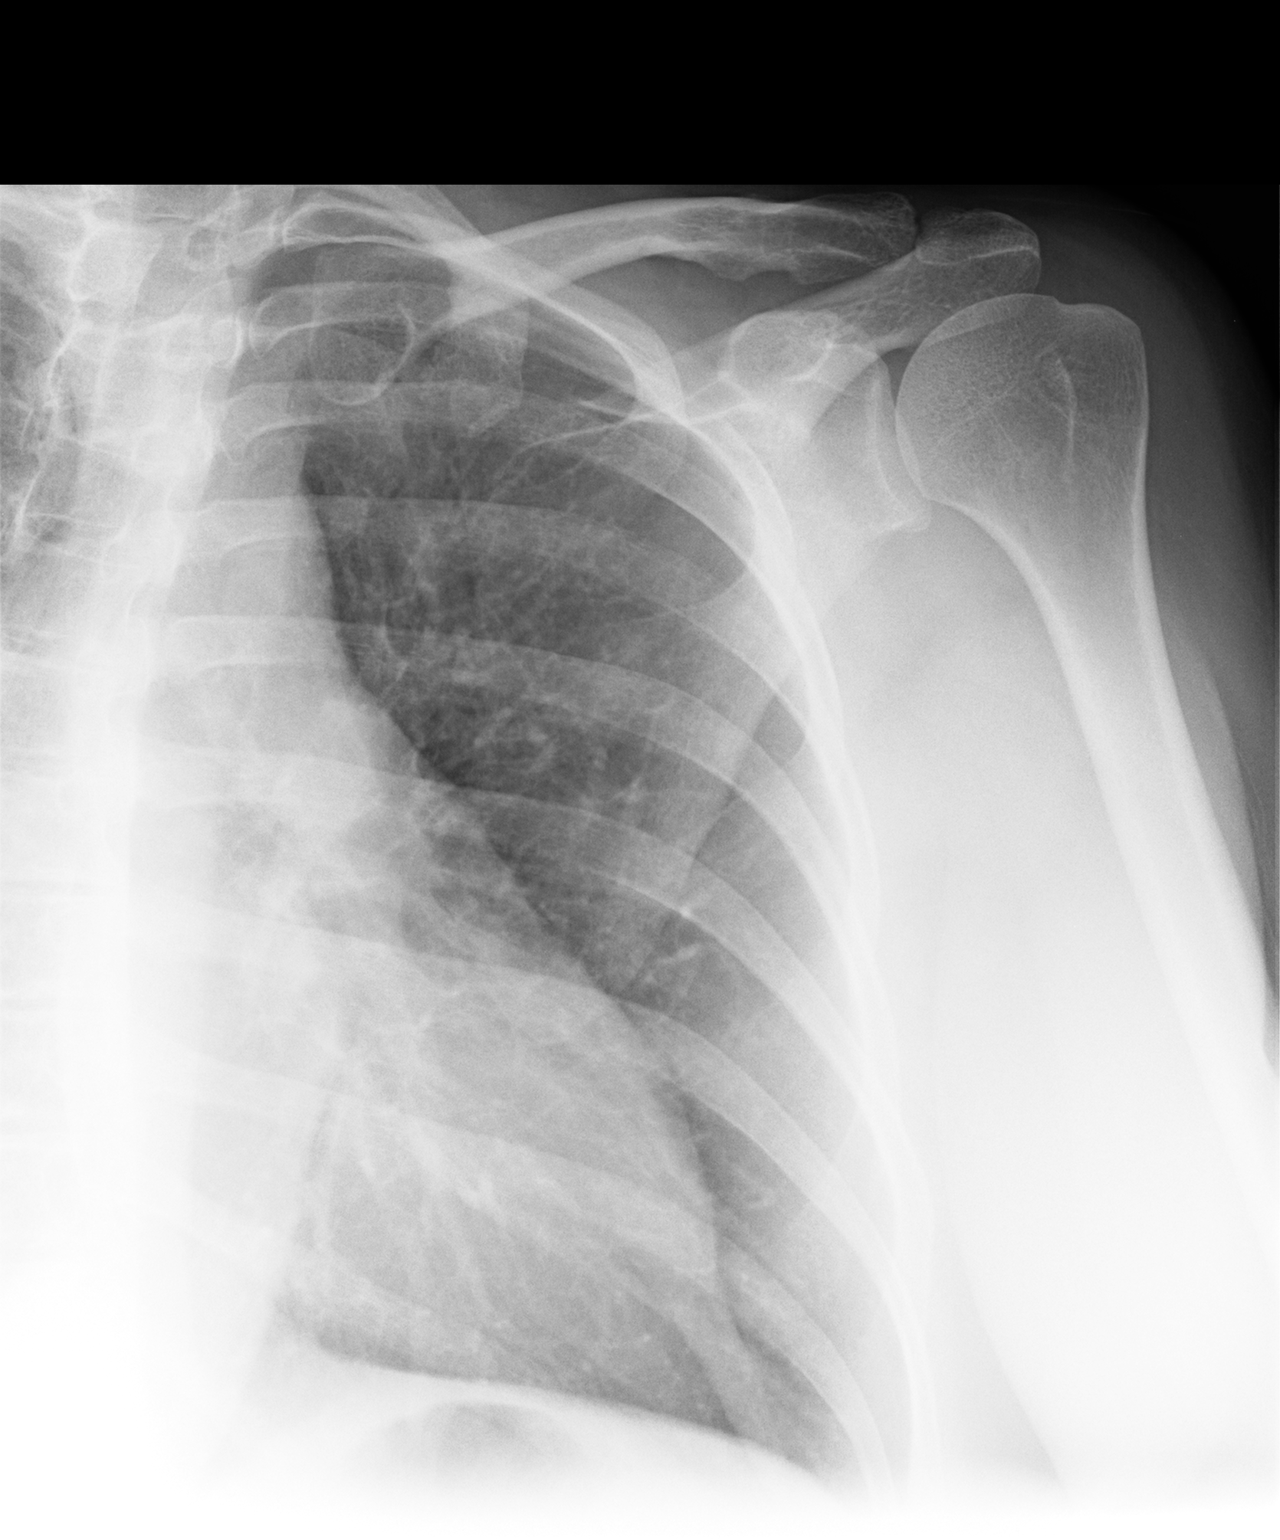

[view not recorded (2 of 3)]
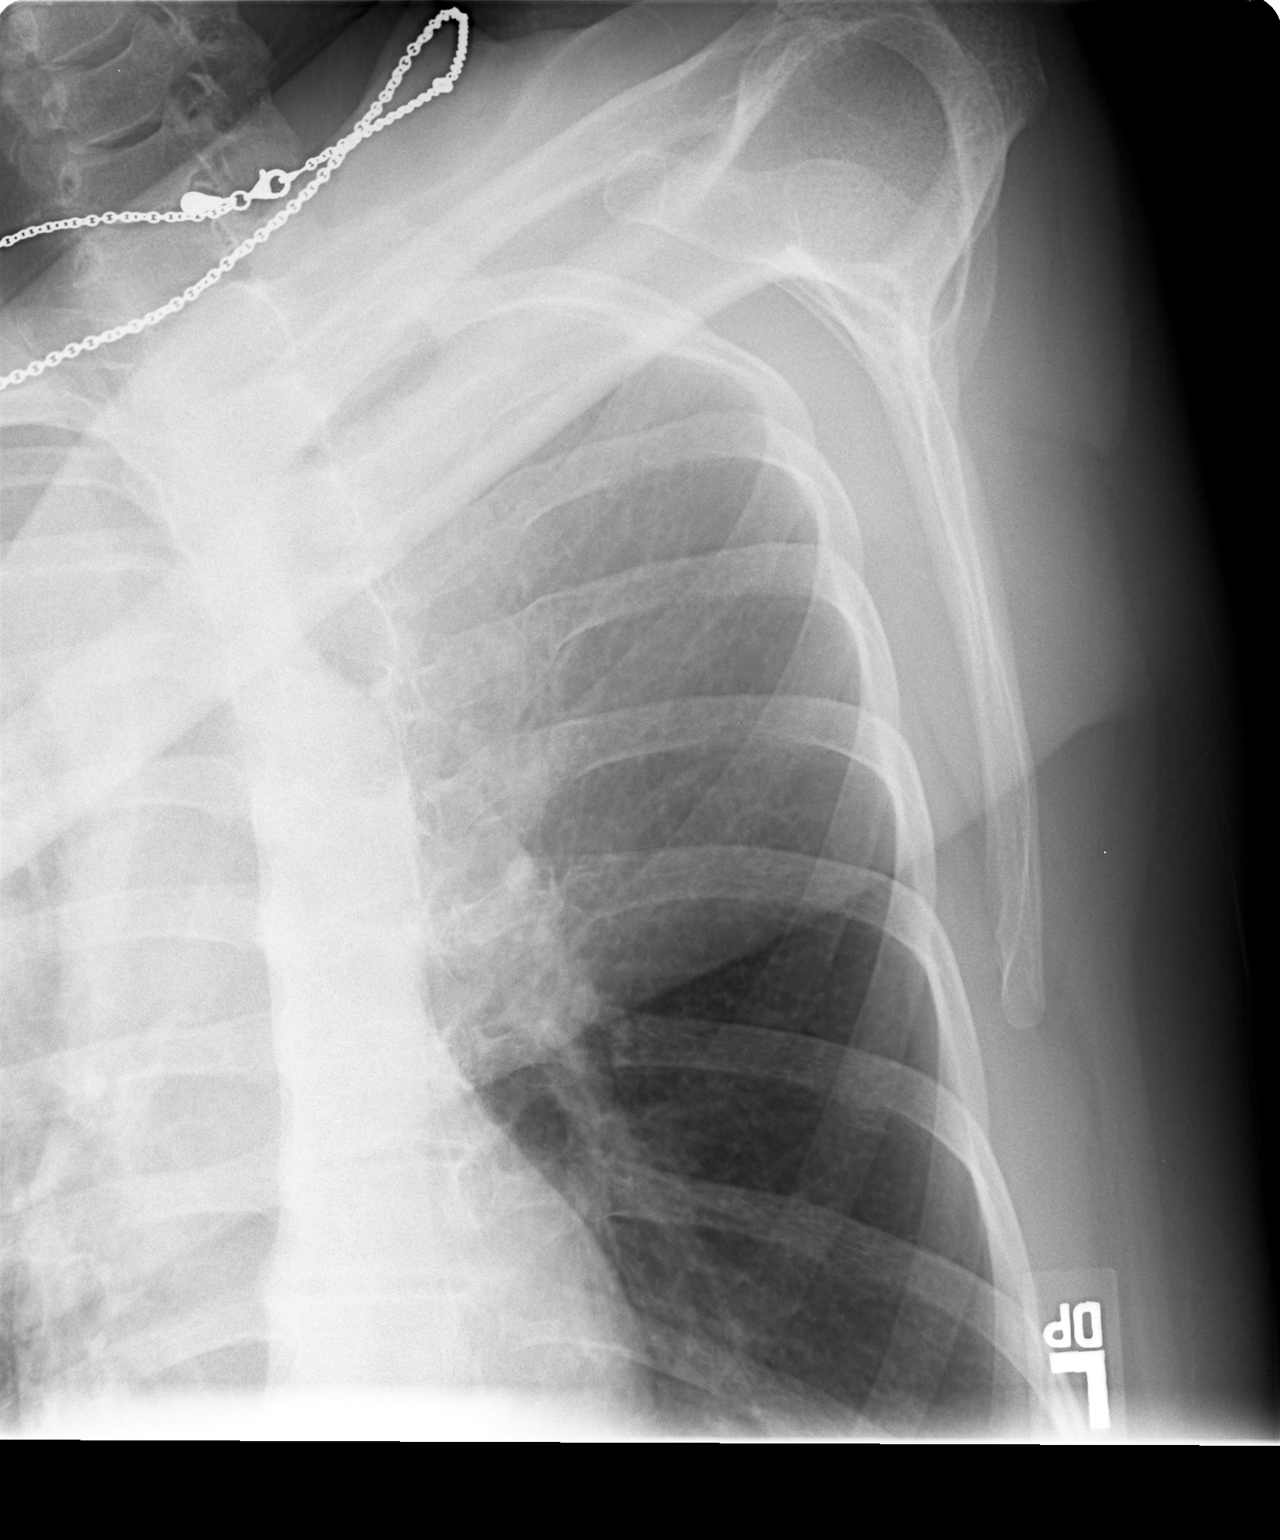

[view not recorded (3 of 3)]
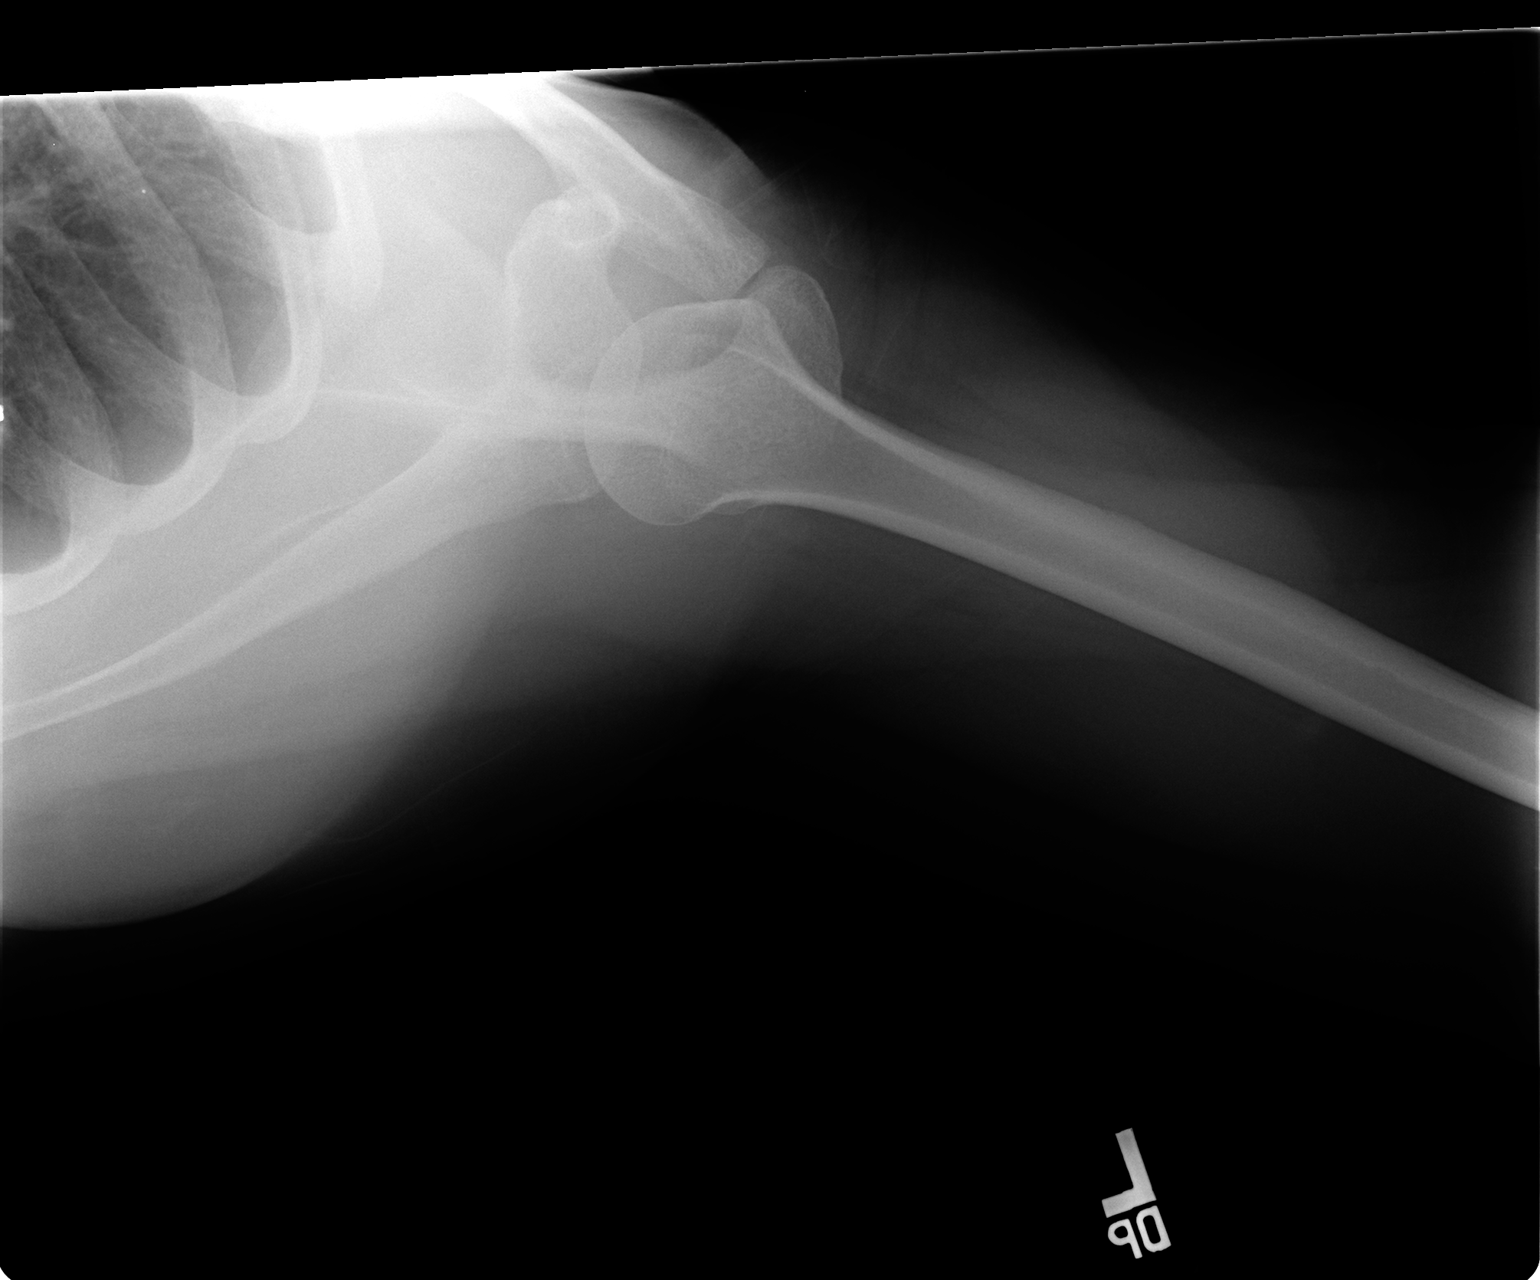

[3 of 3 positions shown; findings below may reference images not displayed]

FINDINGS: Mild acromioclavicular joint degenerative changes.

No significant glenohumeral joint degenerative changes.

No fracture or dislocation.

No abnormal soft tissue calcifications.

Visualized lungs clear.
IMPRESSION: Mild acromioclavicular joint degenerative changes.

## 2017-05-30 ENCOUNTER — Encounter (HOSPITAL_COMMUNITY): Payer: Self-pay | Admitting: Emergency Medicine

## 2017-05-30 ENCOUNTER — Other Ambulatory Visit: Payer: Self-pay

## 2017-05-30 ENCOUNTER — Emergency Department (HOSPITAL_COMMUNITY)
Admission: EM | Admit: 2017-05-30 | Discharge: 2017-05-30 | Disposition: A | Discharge status: Home / Self Care | Payer: 59 | Attending: Emergency Medicine | Admitting: Emergency Medicine

## 2017-05-30 DIAGNOSIS — Z79899 Other long term (current) drug therapy: Secondary | ICD-10-CM | POA: Insufficient documentation

## 2017-05-30 DIAGNOSIS — F17210 Nicotine dependence, cigarettes, uncomplicated: Secondary | ICD-10-CM | POA: Insufficient documentation

## 2017-05-30 DIAGNOSIS — F419 Anxiety disorder, unspecified: Secondary | ICD-10-CM | POA: Insufficient documentation

## 2017-05-30 DIAGNOSIS — Z85820 Personal history of malignant melanoma of skin: Secondary | ICD-10-CM | POA: Insufficient documentation

## 2017-05-30 DIAGNOSIS — L02411 Cutaneous abscess of right axilla: Secondary | ICD-10-CM

## 2017-05-30 DIAGNOSIS — J45909 Unspecified asthma, uncomplicated: Secondary | ICD-10-CM | POA: Insufficient documentation

## 2017-05-30 MED ORDER — PROMETHAZINE HCL 12.5 MG PO TABS: 12.5000 mg | ORAL_TABLET | Freq: Four times a day (QID) | ORAL | 0 refills | Status: AC | PRN

## 2017-05-30 MED ORDER — LIDOCAINE HCL (PF) 1 % IJ SOLN
INTRAMUSCULAR | Status: AC
  Filled 2017-05-30: qty 10

## 2017-05-30 MED ORDER — PROMETHAZINE HCL 12.5 MG PO TABS
25.0000 mg | ORAL_TABLET | Freq: Once | ORAL | Status: AC
  Administered 2017-05-30: 25 mg via ORAL
  Filled 2017-05-30: qty 2

## 2017-05-30 MED ORDER — DOXYCYCLINE HYCLATE 100 MG PO CAPS: 100.0000 mg | ORAL_CAPSULE | Freq: Two times a day (BID) | ORAL | 0 refills | Status: AC

## 2017-05-30 MED ORDER — HYDROCODONE-ACETAMINOPHEN 5-325 MG PO TABS
2.0000 | ORAL_TABLET | Freq: Once | ORAL | Status: AC
  Administered 2017-05-30: 2 via ORAL
  Filled 2017-05-30: qty 2

## 2017-05-30 MED ORDER — DOXYCYCLINE HYCLATE 100 MG PO TABS
100.0000 mg | ORAL_TABLET | Freq: Once | ORAL | Status: AC
  Administered 2017-05-30: 100 mg via ORAL
  Filled 2017-05-30: qty 1

## 2017-05-30 MED ORDER — CLINDAMYCIN HCL 150 MG PO CAPS
300.0000 mg | ORAL_CAPSULE | Freq: Once | ORAL | Status: AC
  Administered 2017-05-30: 300 mg via ORAL
  Filled 2017-05-30: qty 2

## 2017-05-30 MED ORDER — HYDROCODONE-ACETAMINOPHEN 7.5-325 MG PO TABS: 1.0000 | ORAL_TABLET | ORAL | 0 refills | Status: AC | PRN

## 2017-05-30 NOTE — ED Provider Notes (Signed)
[facility] EMERGENCY DEPARTMENT Provider Note   CSN: [id number] Arrival date & time: 05/30/17  1305     History   Chief Complaint Chief Complaint  Patient presents with  . Abscess    HPI Debbie Campbell is a 47 y.o. female.  Patient is a 47 year old female who presents to the emergency department with an abscess of the right axilla.  The patient states that she has had redness and swelling under her right arm for the last several days.  She tried to puncture the abscess herself, and then noticed even more redness pain and swelling.  She now has difficulty with raising her arm.  No reported fever.  Patient complains of pain of the upper arm and even a small area of the upper chest.  No nausea, or vomiting reported.  Patient denies history of diabetes.  She denies history of methicillin-resistant staph.  The history is provided by the patient.  Abscess    Past Medical History:  Diagnosis Date  . Asthma   . 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 Abdominal pain, other specified site 12/11/2012  . Visit for pelvic exam 10/16/2012  . Screening for breast cancer 10/16/2012  . Medication management 10/16/2012  . Hemorrhoid 10/16/2012  . Periodic health assessment, general screening, adult 09/22/2012  . GERD (gastroesophageal reflux disease) 08/26/2012  . Nausea and vomiting 08/12/2012  . Anxiety state, unspecified 06/18/2012  . Bereavement 06/18/2012  . Concentration deficit 06/18/2012  . Headache(784.0) 06/18/2012    Past Surgical History:  Procedure Laterality Date  . ABDOMINAL HYSTERECTOMY  2014   Dr. Amalia Hailey, menorrhagia, ovaries intact  . APPENDECTOMY    . TUBAL LIGATION       OB History   None      Home Medications    Prior to Admission medications   Medication Sig Start Date End Date Taking? Authorizing Provider  ALPRAZolam Duanne Moron) 0.5  MG tablet Take 1 tablet (0.5 mg total) by mouth 2 (two) times daily as needed for anxiety. 01/11/13   Rey, Raquel M, NP  butalbital-acetaminophen-caffeine (FIORICET, ESGIC) 50-325-40 MG per tablet TAKE ONE TABLET BY MOUTH TWICE DAILY AS NEEDED FOR HEADACHE 12/16/12   Rey, Raquel M, NP  FLUoxetine (PROZAC) 20 MG capsule TAKE ONE CAPSULE BY MOUTH TWICE DAILY    Rey, Raquel M, NP  hydrocortisone (ANUCORT-HC) 25 MG suppository Place 1 suppository (25 mg total) rectally 2 (two) times daily. 10/16/12   Rey, Latina Craver, NP  hydrocortisone (ANUSOL-HC) 2.5 % rectal cream Place rectally 2 (two) times daily. 10/16/12   Rey, Latina Craver, NP  ondansetron (ZOFRAN-ODT) 4 MG disintegrating tablet Take 4 mg by mouth every 8 (eight) hours as needed.  08/09/12   [provider]  pantoprazole (PROTONIX) 20 MG tablet Take 1 tablet (20 mg total) by mouth daily. 08/26/12   Rey, Latina Craver, NP  promethazine (PHENERGAN) 25 MG suppository Place 25 mg rectally every 6 (six) hours as needed.  08/10/12   [provider]  VENTOLIN HFA 108 (90 BASE) MCG/ACT inhaler INHALE ONE PUFF EVERY 6 HOURS AS NEEDED FOR WHEEZING    Rey, Raquel M, NP    Family History Family History  Problem Relation Age of Onset  . Hypertension Mother   . Arthritis Father   . Cirrhosis Father        from tylenol  toxicity    Social History Social History   Tobacco Use  . Smoking status: Current Every Day Smoker    Packs/day: 0.25    Types: Cigarettes    Last attempt to quit: 09/07/2012    Years since quitting: 4.7  . Smokeless tobacco: Never Used  Substance Use Topics  . Alcohol use: No  . Drug use: Not on file     Allergies   Aspirin and Penicillins   Review of Systems Review of Systems  Constitutional: Positive for activity change.       All ROS Neg except as noted in HPI  HENT: Negative for nosebleeds.   Eyes: Negative for photophobia and discharge.  Respiratory: Negative for cough, shortness of breath and wheezing.     Cardiovascular: Negative for chest pain and palpitations.  Gastrointestinal: Negative for abdominal pain and blood in stool.  Genitourinary: Negative for dysuria, frequency and hematuria.  Musculoskeletal: Negative for arthralgias, back pain and neck pain.  Skin: Negative.        Abscess  Neurological: Negative for dizziness, seizures and speech difficulty.  Psychiatric/Behavioral: Negative for confusion and hallucinations. The patient is nervous/anxious.      Physical Exam Updated Vital Signs BP 112/85 (BP Location: Left Arm)   Pulse (!) 107   Temp 99.3 F (37.4 C) (Oral)   Resp 18   Ht 5\' 3"  (1.6 m)   Wt 68 kg (150 lb)   SpO2 100%   BMI 26.57 kg/m   Physical Exam  Constitutional: She is oriented to person, place, and time. She appears well-developed and well-nourished.  Non-toxic appearance.  HENT:  Head: Normocephalic.  Right Ear: Tympanic membrane and external ear normal.  Left Ear: Tympanic membrane and external ear normal.  Eyes: Pupils are equal, round, and reactive to light. EOM and lids are normal.  Neck: Normal range of motion. Neck supple. Carotid bruit is not present.  Cardiovascular: Regular rhythm, normal heart sounds, intact distal pulses and normal pulses. Tachycardia present.  Pulmonary/Chest: Breath sounds normal. No respiratory distress.  Abdominal: Soft. Bowel sounds are normal. There is no tenderness. There is no guarding.  Musculoskeletal: Normal range of motion.  Patient states she has too much pain when attempting to examine the right axilla area.  The radial pulses 2+.  The capillary refill is 2 seconds.  There are no visible red streaks on limited examination.  Lymphadenopathy:       Head (right side): No submandibular adenopathy present.       Head (left side): No submandibular adenopathy present.    She has no cervical adenopathy.  Neurological: She is alert and oriented to person, place, and time. She has normal strength. No cranial nerve  deficit or sensory deficit.  Skin: Skin is warm and dry.  Psychiatric: She has a normal mood and affect. Her speech is normal.  Nursing note and vitals reviewed.    ED Treatments / Results  Labs (all labs ordered are listed, but only abnormal results are displayed) Labs Reviewed - No data to display  EKG None  Radiology No results found.  Procedures Procedures (including critical care time)  Medications Ordered in ED Medications  lidocaine (PF) (XYLOCAINE) 1 % injection (has no administration in time range)  doxycycline (VIBRA-TABS) tablet 100 mg (100 mg Oral Given 05/30/17 1452)  HYDROcodone-acetaminophen (NORCO/VICODIN) 5-325 MG per tablet 2 tablet (2 tablets Oral Given 05/30/17 1452)  promethazine (PHENERGAN) tablet 25 mg (25 mg Oral Given 05/30/17 1452)  clindamycin (CLEOCIN) capsule 300  mg (300 mg Oral Given 05/30/17 1452)     Initial Impression / Assessment and Plan / ED Course  I have reviewed the triage vital signs and the nursing notes.  Pertinent labs & imaging results that were available during my care of the patient were reviewed by me and considered in my medical decision making (see chart for details).       Final Clinical Impressions(s) / ED Diagnoses MDM  Vital signs reviewed.  Heart rate elevated at 107.  Attempted to examine the abscess area, but the patient says she cannot stand to raise her arm and cannot stand to allow further examination.  Patient was treated in the emergency department with Norco, clindamycin, and doxycycline.  After about 40 minutes, I reassessed the patient.  She again says that she cannot stand to raise her arm and does not feel like she could allow me to perform an incision and drainage at this time.  I discussed with the patient the possibility of this area worsening, even the infection moving to other areas.  The patient states that she would rather do the warm tub soaks, be placed on antibiotics, and see 1 of the surgeons.  She  feels that she could only have this procedure done under anesthesia.  Prescription for Norco, and doxycycline given to the patient.  Patient given instructions to do warm Epson salt soaks 2 times daily.  Patient referred to general surgery.   Final diagnoses:  Abscess of axilla, right    ED Discharge Orders    None       Lily Kocher, PA-C 05/30/17 [place], [place], DO 06/01/17 [phone]

## 2017-05-30 NOTE — ED Notes (Signed)
Swelling and redness noted to right axilla with area of induration.  Difficult for pt to raise arm due to pain.  No chest wall swelling noted, minimal redness.  Difficult to assess redness due to pt's skin tone and sun exposure.  No drainage or area of fluctuation.

## 2017-05-30 NOTE — ED Triage Notes (Signed)
Pt c/o of an abscess on her right armpit. Pt tried to puncture the abscess but  States " I made it worse".  Redness and swelling noted to area

## 2017-05-30 NOTE — Discharge Instructions (Addendum)
Please use warm Epson salt soaks to your right underarm area 2 times daily for about 15 minutes each.  Please use doxycycline 2 times daily with food.  Use Norco every 4 hours as needed for pain.  This medication may cause drowsiness, please use it with caution.  Please use promethazine for nausea if needed.  Please see Dr. Constance Haw for surgical evaluation as soon as possible.  Please call Dr. Constance Haw on Monday, April 15  to establish an appointment.

## 2017-06-10 ENCOUNTER — Ambulatory Visit: Payer: Self-pay | Admitting: General Surgery

## 2018-12-30 ENCOUNTER — Encounter (HOSPITAL_COMMUNITY): Payer: Self-pay | Admitting: Emergency Medicine

## 2018-12-30 ENCOUNTER — Emergency Department (HOSPITAL_COMMUNITY)
Admission: EM | Admit: 2018-12-30 | Discharge: 2018-12-30 | Disposition: A | Discharge status: Home / Self Care | Payer: Self-pay | Attending: Emergency Medicine | Admitting: Emergency Medicine

## 2018-12-30 ENCOUNTER — Other Ambulatory Visit: Payer: Self-pay

## 2018-12-30 DIAGNOSIS — F17210 Nicotine dependence, cigarettes, uncomplicated: Secondary | ICD-10-CM | POA: Insufficient documentation

## 2018-12-30 DIAGNOSIS — L02511 Cutaneous abscess of right hand: Secondary | ICD-10-CM | POA: Insufficient documentation

## 2018-12-30 DIAGNOSIS — J45909 Unspecified asthma, uncomplicated: Secondary | ICD-10-CM | POA: Insufficient documentation

## 2018-12-30 DIAGNOSIS — L0291 Cutaneous abscess, unspecified: Secondary | ICD-10-CM

## 2018-12-30 DIAGNOSIS — C4372 Malignant melanoma of left lower limb, including hip: Secondary | ICD-10-CM | POA: Insufficient documentation

## 2018-12-30 MED ORDER — POVIDONE-IODINE 10 % EX SOLN: CUTANEOUS | Status: DC | PRN

## 2018-12-30 MED ORDER — PENTAFLUOROPROP-TETRAFLUOROETH EX AERO
INHALATION_SPRAY | CUTANEOUS | Status: DC | PRN
  Administered 2018-12-30: 1 via TOPICAL
  Filled 2018-12-30: qty 116

## 2018-12-30 MED ORDER — SULFAMETHOXAZOLE-TRIMETHOPRIM 800-160 MG PO TABS
1.0000 | ORAL_TABLET | Freq: Once | ORAL | Status: AC
  Administered 2018-12-30: 21:00:00 1 via ORAL
  Filled 2018-12-30: qty 1

## 2018-12-30 MED ORDER — SULFAMETHOXAZOLE-TRIMETHOPRIM 800-160 MG PO TABS: 1.0000 | ORAL_TABLET | Freq: Two times a day (BID) | ORAL | 0 refills | Status: AC

## 2018-12-30 MED ORDER — SULFAMETHOXAZOLE-TRIMETHOPRIM 800-160 MG PO TABS: 1.0000 | ORAL_TABLET | Freq: Two times a day (BID) | ORAL | 0 refills | Status: DC

## 2018-12-30 NOTE — Discharge Instructions (Addendum)
Finish the entire course of the antibiotic prescribed taking your next dose tomorrow morning.  Continue with your warm water soaks applying gentle massage around the site when soaking - do this for 10 minutes 3-4 times daily.  Get rechecked if you develop any worse or spreading symptoms.

## 2018-12-30 NOTE — ED Triage Notes (Signed)
Patient states she thinks she got a spider bite on her R hand on Thursday. Had a small area which she scratched. Now red and painful. Swelling noted at the base of the thumb.

## 2018-12-31 NOTE — ED Provider Notes (Signed)
[facility] EMERGENCY DEPARTMENT Provider Note   CSN: [id number] Arrival date & time: 12/30/18  1518     History   Chief Complaint Chief Complaint  Patient presents with  . Wound Infection    HPI Debbie Campbell is a 48 y.o. female presenting with a 6-day history of a wound on her right hand.  She suspects a spider bite although denies witnessing a spider.  She reports initial symptom of itching at the site which she scratched copiously, it has since become red, swollen and more painful.  She has been applying warm salt water soaks and also expressed purulence from the wound site on 2 occasions prior to today.  She states the swelling and redness is improved but it continues to be painful.  She denies radiation of pain.  She had no fevers or chills, no other symptoms.     The history is provided by the patient.    Past Medical History:  Diagnosis Date  . Asthma   . 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 Abdominal pain, other specified site 12/11/2012  . Visit for pelvic exam 10/16/2012  . Screening for breast cancer 10/16/2012  . Medication management 10/16/2012  . Hemorrhoid 10/16/2012  . Periodic health assessment, general screening, adult 09/22/2012  . GERD (gastroesophageal reflux disease) 08/26/2012  . Nausea and vomiting 08/12/2012  . Anxiety state, unspecified 06/18/2012  . Bereavement 06/18/2012  . Concentration deficit 06/18/2012  . Headache(784.0) 06/18/2012    Past Surgical History:  Procedure Laterality Date  . ABDOMINAL HYSTERECTOMY  2014   Dr. Amalia Hailey, menorrhagia, ovaries intact  . APPENDECTOMY    . TUBAL LIGATION       OB History    Gravida  1   Para  1   Term  1   Preterm      AB      Living        SAB      TAB      Ectopic      Multiple      Live Births               Home Medications    Prior to  Admission medications   Medication Sig Start Date End Date Taking? Authorizing Provider  ALPRAZolam Duanne Moron) 0.5 MG tablet Take 1 tablet (0.5 mg total) by mouth 2 (two) times daily as needed for anxiety. 01/11/13   Rey, Latina Craver, NP  butalbital-acetaminophen-caffeine (FIORICET, ESGIC) 50-325-40 MG per tablet TAKE ONE TABLET BY MOUTH TWICE DAILY AS NEEDED FOR HEADACHE 12/16/12   Rey, Latina Craver, NP  doxycycline (VIBRAMYCIN) 100 MG capsule Take 1 capsule (100 mg total) by mouth 2 (two) times daily. 05/30/17   Lily Kocher, PA-C  FLUoxetine (PROZAC) 20 MG capsule TAKE ONE CAPSULE BY MOUTH TWICE DAILY    Rey, Raquel M, NP  HYDROcodone-acetaminophen (NORCO) 7.5-325 MG tablet Take 1 tablet by mouth every 4 (four) hours as needed. 05/30/17   Lily Kocher, PA-C  hydrocortisone (ANUCORT-HC) 25 MG suppository Place 1 suppository (25 mg total) rectally 2 (two) times daily. 10/16/12   Rey, Latina Craver, NP  hydrocortisone (ANUSOL-HC) 2.5 % rectal cream Place rectally 2 (two) times daily. 10/16/12   Rey, Latina Craver, NP  ondansetron (ZOFRAN-ODT) 4 MG disintegrating tablet Take 4 mg by mouth every 8 (eight) hours as needed.  08/09/12   [provider]  pantoprazole (PROTONIX) 20 MG tablet Take 1 tablet (20 mg total) by mouth daily. 08/26/12   Rey, Latina Craver, NP  promethazine (PHENERGAN) 12.5 MG tablet Take 1 tablet (12.5 mg total) by mouth every 6 (six) hours as needed for nausea or vomiting. 05/30/17   Lily Kocher, PA-C  sulfamethoxazole-trimethoprim (BACTRIM DS) 800-160 MG tablet Take 1 tablet by mouth 2 (two) times daily for 10 days. 12/30/18 01/09/19  Ketra Duchesne, Almyra Free, PA-C  VENTOLIN HFA 108 (90 BASE) MCG/ACT inhaler INHALE ONE PUFF EVERY 6 HOURS AS NEEDED FOR WHEEZING    Rey, Raquel M, NP    Family History Family History  Problem Relation Age of Onset  . Hypertension Mother   . Arthritis Father   . Cirrhosis Father        from tylenol toxicity    Social History Social History   Tobacco Use  . Smoking  status: Current Every Day Smoker    Packs/day: 0.50    Types: Cigarettes    Last attempt to quit: 09/07/2012    Years since quitting: 6.3  . Smokeless tobacco: Never Used  Substance Use Topics  . Alcohol use: No  . Drug use: Never     Allergies   Aspirin and Penicillins   Review of Systems Review of Systems  Constitutional: Negative for chills and fever.  Respiratory: Negative for shortness of breath and wheezing.   Skin: Positive for color change and wound.  Neurological: Negative for numbness.     Physical Exam Updated Vital Signs BP 118/90 (BP Location: Right Arm)   Pulse 82   Temp 97.9 F (36.6 C) (Oral)   Resp 16   Ht 5\' 4"  (1.626 m)   Wt 72.1 kg   SpO2 100%   BMI 27.29 kg/m   Physical Exam Constitutional:      Appearance: She is well-developed.  HENT:     Head: Normocephalic.  Cardiovascular:     Rate and Rhythm: Normal rate.  Pulmonary:     Effort: Pulmonary effort is normal.  Skin:    Findings: Abscess present.     Comments: There is a 1 cm raised slightly fluctuant abscess right hand at the base of her first MCP dorsally.  There is a small central eschar from prior drainage which is currently sealed.  There is no red streaking.  Neurological:     Mental Status: She is alert and oriented to person, place, and time.     Sensory: No sensory deficit.      ED Treatments / Results  Labs (all labs ordered are listed, but only abnormal results are displayed) Labs Reviewed - No data to display  EKG None  Radiology No results found.  Procedures Procedures (including critical care time)  Discussed pros and cons of I&D and antibiotics versus antibiotics alone and continued soaks.  Patient agrees to I&D procedure.  INCISION AND DRAINAGE Performed by: Evalee Jefferson Consent: Verbal consent obtained. Risks and benefits: risks, benefits and alternatives were discussed Type: abscess  Body area: Right hand  Anesthesia: Topical  freeze spray   Incision was made with a scalpel.  Local anesthetic: Freeze spray  Anesthetic total: N/A  Complexity: complex Blunt dissection to break up loculations  Drainage: Serosanguineous fluid followed by a trace of purulence  Drainage amount: Small  Packing material: 1/4 in iodoform gauze  Patient tolerance: Patient tolerated the procedure well with no immediate complications.     Medications Ordered in ED Medications  sulfamethoxazole-trimethoprim (BACTRIM DS) 800-160 MG per tablet 1 tablet (1 tablet Oral Given 12/30/18 2032)     Initial Impression / Assessment and Plan / ED Course  I have reviewed the triage vital signs and the nursing notes.  Pertinent labs & imaging results that were available during my care of the patient were reviewed by me and considered in my medical decision making (see chart for details).        Patient had pictures of her abscess site on her phone prior to her expressing purulence yesterday.  It was substantially more enlarged yesterday.  However I&D today did obtain a small amount of further purulence.  She was encouraged to continue with her home warm soaks, she was also placed on Bactrim.  Return precautions were discussed with patient.  Final Clinical Impressions(s) / ED Diagnoses   Final diagnoses:  Abscess    ED Discharge Orders         Ordered    sulfamethoxazole-trimethoprim (BACTRIM DS) 800-160 MG tablet  2 times daily,   Status:  Discontinued     12/30/18 2006    sulfamethoxazole-trimethoprim (BACTRIM DS) 800-160 MG tablet  2 times daily     12/30/18 2051           Evalee Jefferson, PA-C 12/31/18 0111    Fredia Sorrow, MD 01/05/19 [phone]
